# Patient Record
Sex: Female | Born: 1935 | Race: Black or African American | Hispanic: No | Smoking: Former smoker
Health system: Southern US, Community
[De-identification: ages and names within clinical notes are randomized; demographics above are authoritative.]

## PROBLEM LIST (undated history)

## (undated) DIAGNOSIS — M1990 Unspecified osteoarthritis, unspecified site: Secondary | ICD-10-CM

## (undated) DIAGNOSIS — I10 Essential (primary) hypertension: Secondary | ICD-10-CM

## (undated) DIAGNOSIS — E785 Hyperlipidemia, unspecified: Secondary | ICD-10-CM

## (undated) HISTORY — PX: OTHER SURGICAL HISTORY: SHX169

## (undated) HISTORY — DX: Unspecified osteoarthritis, unspecified site: M19.90

## (undated) HISTORY — DX: Essential (primary) hypertension: I10

## (undated) HISTORY — DX: Hyperlipidemia, unspecified: E78.5

---

## 1998-04-05 ENCOUNTER — Other Ambulatory Visit: Admission: RE | Admit: 1998-04-05 | Discharge: 1998-04-05 | Payer: Self-pay | Admitting: Family Medicine

## 1998-04-05 ENCOUNTER — Encounter: Admission: RE | Admit: 1998-04-05 | Discharge: 1998-04-05 | Payer: Self-pay | Admitting: Family Medicine

## 1998-10-11 ENCOUNTER — Encounter: Admission: RE | Admit: 1998-10-11 | Discharge: 1998-10-11 | Payer: Self-pay | Admitting: Family Medicine

## 1999-02-25 ENCOUNTER — Encounter: Admission: RE | Admit: 1999-02-25 | Discharge: 1999-02-25 | Payer: Self-pay | Admitting: Family Medicine

## 1999-03-18 ENCOUNTER — Encounter: Admission: RE | Admit: 1999-03-18 | Discharge: 1999-03-18 | Payer: Self-pay | Admitting: Family Medicine

## 1999-04-06 ENCOUNTER — Encounter: Admission: RE | Admit: 1999-04-06 | Discharge: 1999-04-06 | Payer: Self-pay | Admitting: Family Medicine

## 1999-05-05 ENCOUNTER — Encounter: Admission: RE | Admit: 1999-05-05 | Discharge: 1999-05-05 | Payer: Self-pay | Admitting: Family Medicine

## 1999-05-09 ENCOUNTER — Encounter: Admission: RE | Admit: 1999-05-09 | Discharge: 1999-05-09 | Payer: Self-pay | Admitting: Family Medicine

## 1999-06-20 ENCOUNTER — Encounter: Payer: Self-pay | Admitting: Family Medicine

## 1999-06-20 ENCOUNTER — Encounter: Admission: RE | Admit: 1999-06-20 | Discharge: 1999-06-20 | Payer: Self-pay | Admitting: Family Medicine

## 1999-09-09 ENCOUNTER — Encounter: Admission: RE | Admit: 1999-09-09 | Discharge: 1999-09-09 | Payer: Self-pay | Admitting: Family Medicine

## 1999-10-24 ENCOUNTER — Encounter: Admission: RE | Admit: 1999-10-24 | Discharge: 1999-10-24 | Payer: Self-pay | Admitting: Family Medicine

## 2000-02-20 ENCOUNTER — Encounter: Admission: RE | Admit: 2000-02-20 | Discharge: 2000-02-20 | Payer: Self-pay | Admitting: Family Medicine

## 2000-05-28 ENCOUNTER — Encounter: Admission: RE | Admit: 2000-05-28 | Discharge: 2000-05-28 | Payer: Self-pay | Admitting: Family Medicine

## 2000-06-21 ENCOUNTER — Encounter: Payer: Self-pay | Admitting: Family Medicine

## 2000-06-21 ENCOUNTER — Ambulatory Visit (HOSPITAL_COMMUNITY): Admission: RE | Admit: 2000-06-21 | Discharge: 2000-06-21 | Payer: Self-pay | Admitting: Family Medicine

## 2000-06-27 ENCOUNTER — Encounter: Payer: Self-pay | Admitting: Family Medicine

## 2000-06-27 ENCOUNTER — Encounter: Admission: RE | Admit: 2000-06-27 | Discharge: 2000-06-27 | Payer: Self-pay | Admitting: Family Medicine

## 2000-08-31 ENCOUNTER — Encounter: Admission: RE | Admit: 2000-08-31 | Discharge: 2000-08-31 | Payer: Self-pay | Admitting: Family Medicine

## 2001-03-04 ENCOUNTER — Encounter: Admission: RE | Admit: 2001-03-04 | Discharge: 2001-03-04 | Payer: Self-pay | Admitting: Family Medicine

## 2001-07-02 ENCOUNTER — Ambulatory Visit (HOSPITAL_COMMUNITY): Admission: RE | Admit: 2001-07-02 | Discharge: 2001-07-02 | Payer: Self-pay | Admitting: Obstetrics and Gynecology

## 2001-07-02 ENCOUNTER — Encounter: Payer: Self-pay | Admitting: Obstetrics and Gynecology

## 2002-01-14 ENCOUNTER — Encounter (INDEPENDENT_AMBULATORY_CARE_PROVIDER_SITE_OTHER): Payer: Self-pay | Admitting: *Deleted

## 2002-01-31 ENCOUNTER — Encounter: Admission: RE | Admit: 2002-01-31 | Discharge: 2002-01-31 | Payer: Self-pay | Admitting: Family Medicine

## 2002-02-14 ENCOUNTER — Encounter: Admission: RE | Admit: 2002-02-14 | Discharge: 2002-02-14 | Payer: Self-pay | Admitting: Family Medicine

## 2002-03-07 ENCOUNTER — Encounter: Admission: RE | Admit: 2002-03-07 | Discharge: 2002-03-07 | Payer: Self-pay | Admitting: Family Medicine

## 2002-07-03 ENCOUNTER — Encounter: Payer: Self-pay | Admitting: Obstetrics and Gynecology

## 2002-07-03 ENCOUNTER — Ambulatory Visit (HOSPITAL_COMMUNITY): Admission: RE | Admit: 2002-07-03 | Discharge: 2002-07-03 | Payer: Self-pay | Admitting: Obstetrics and Gynecology

## 2003-06-19 ENCOUNTER — Encounter: Admission: RE | Admit: 2003-06-19 | Discharge: 2003-06-19 | Payer: Self-pay | Admitting: Family Medicine

## 2003-07-07 ENCOUNTER — Ambulatory Visit (HOSPITAL_COMMUNITY): Admission: RE | Admit: 2003-07-07 | Discharge: 2003-07-07 | Payer: Self-pay | Admitting: Obstetrics and Gynecology

## 2003-10-05 ENCOUNTER — Encounter: Admission: RE | Admit: 2003-10-05 | Discharge: 2003-10-05 | Payer: Self-pay | Admitting: Family Medicine

## 2004-05-04 ENCOUNTER — Ambulatory Visit: Payer: Self-pay | Admitting: Sports Medicine

## 2004-05-10 ENCOUNTER — Encounter: Admission: RE | Admit: 2004-05-10 | Discharge: 2004-05-10 | Payer: Self-pay | Admitting: Sports Medicine

## 2004-06-13 ENCOUNTER — Ambulatory Visit: Payer: Self-pay | Admitting: Family Medicine

## 2004-07-22 ENCOUNTER — Ambulatory Visit: Payer: Self-pay | Admitting: Family Medicine

## 2004-09-08 ENCOUNTER — Encounter: Admission: RE | Admit: 2004-09-08 | Discharge: 2004-09-08 | Payer: Self-pay | Admitting: Family Medicine

## 2004-12-19 IMAGING — CR DG CHEST 2V
2 series · 2 of 2 positions shown · non-contrast
Comparison: None.

CLINICAL DATA: Chronic cough.

CHEST - 2 VIEW  05/10/2004:

[view not recorded (1 of 2)]
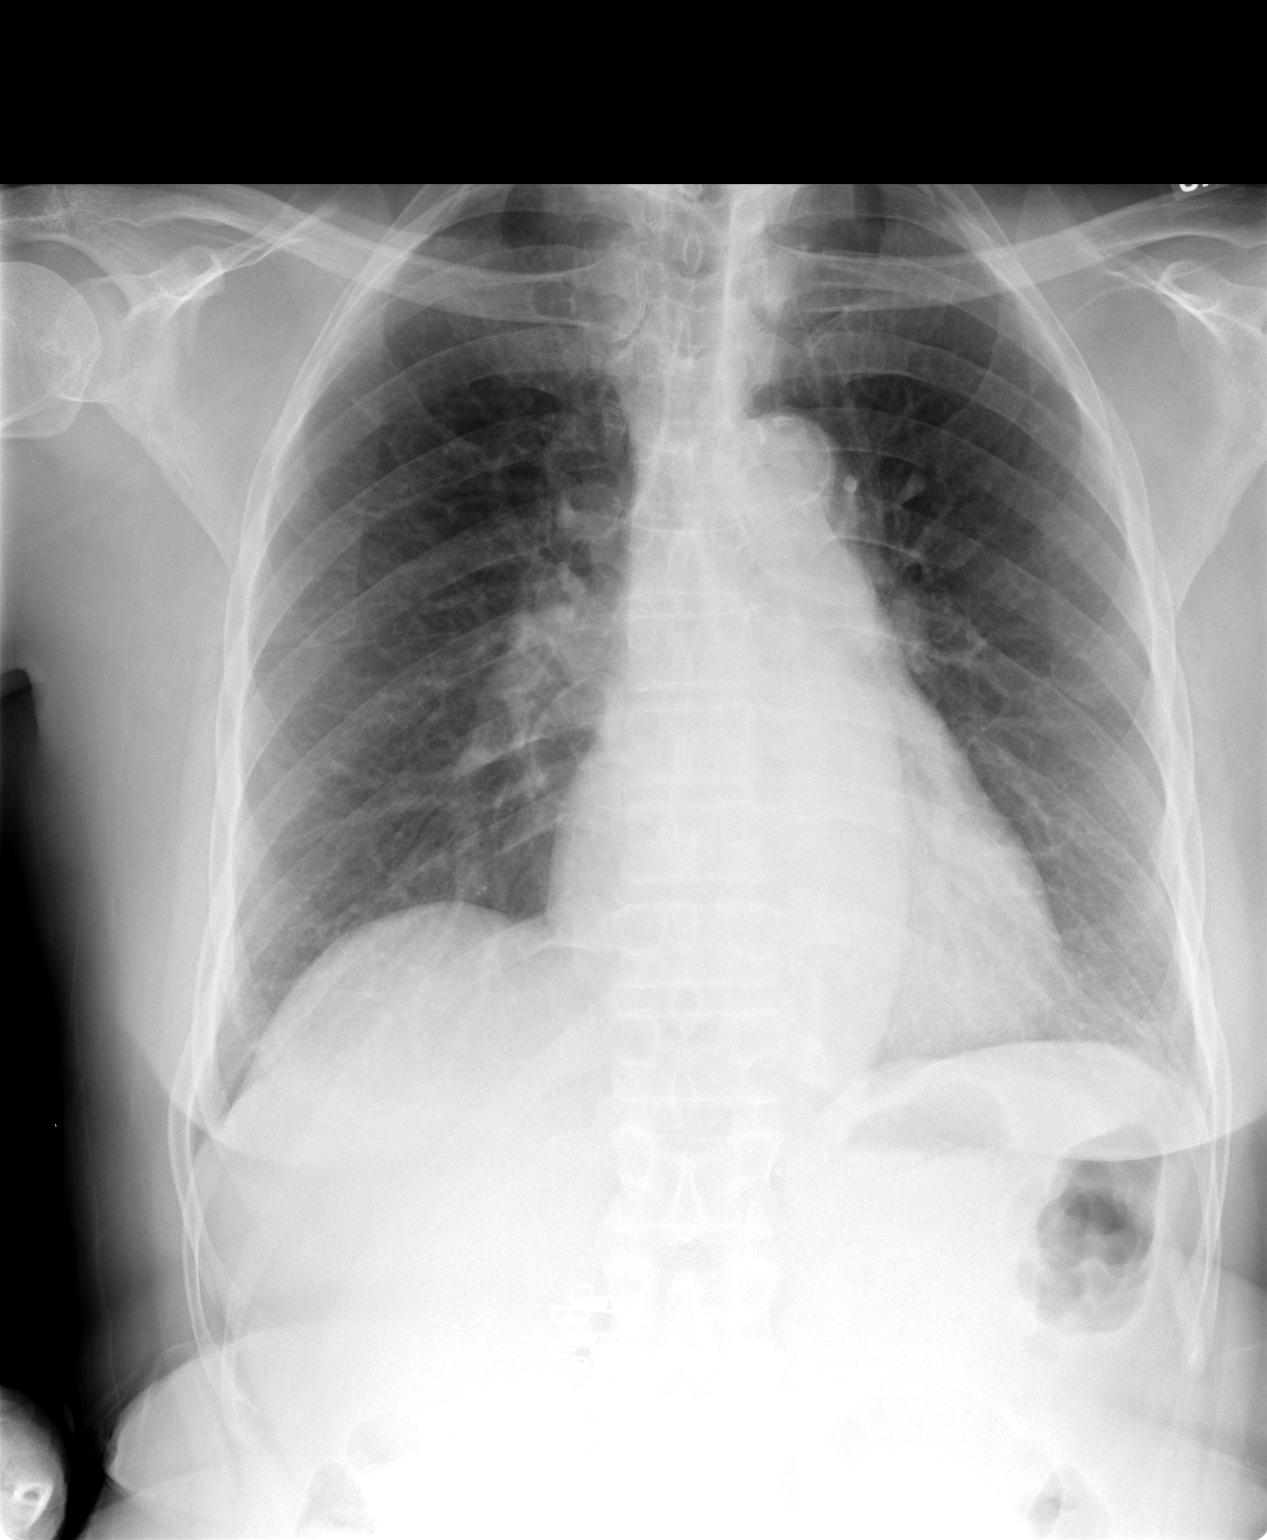

[view not recorded (2 of 2)]
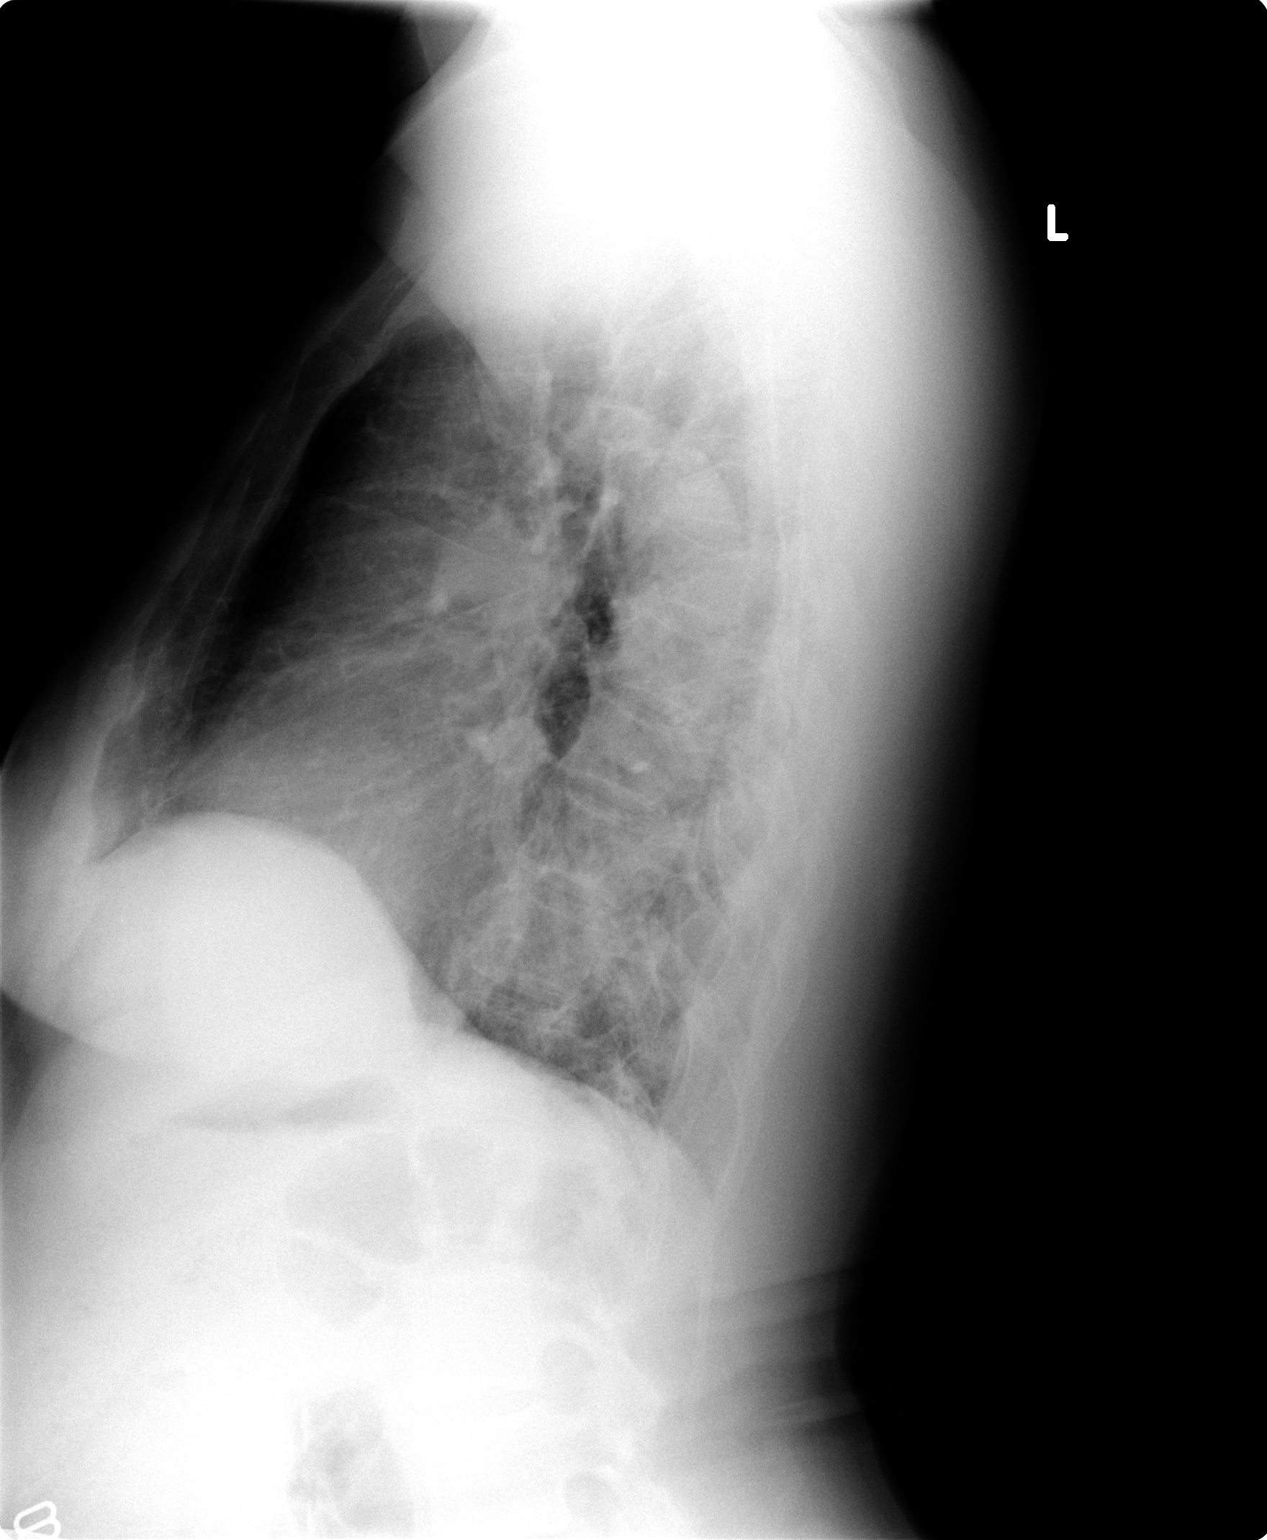

[2 of 2 positions shown; findings below may reference images not displayed]

FINDINGS: The heart size is upper normal to perhaps slightly enlarged. The thoracic aorta is
atherosclerotic. The hilar and mediastinal contours are otherwise unremarkable. The lungs appear
clear. There are no pleural effusions. Mild degenerative changes are present in the thoracic spine.

IMPRESSION

1. Borderline heart size.

2. No evidence of acute disease.

## 2005-03-06 ENCOUNTER — Ambulatory Visit: Payer: Self-pay | Admitting: Family Medicine

## 2005-08-11 ENCOUNTER — Emergency Department (HOSPITAL_COMMUNITY): Admission: EM | Admit: 2005-08-11 | Discharge: 2005-08-11 | Payer: Self-pay | Admitting: Family Medicine

## 2005-10-05 ENCOUNTER — Ambulatory Visit (HOSPITAL_COMMUNITY): Admission: RE | Admit: 2005-10-05 | Discharge: 2005-10-05 | Payer: Self-pay | Admitting: Family Medicine

## 2006-01-08 ENCOUNTER — Ambulatory Visit: Payer: Self-pay | Admitting: Sports Medicine

## 2006-09-13 DIAGNOSIS — M949 Disorder of cartilage, unspecified: Secondary | ICD-10-CM

## 2006-09-13 DIAGNOSIS — R079 Chest pain, unspecified: Secondary | ICD-10-CM

## 2006-09-13 DIAGNOSIS — E7800 Pure hypercholesterolemia, unspecified: Secondary | ICD-10-CM

## 2006-09-13 DIAGNOSIS — M899 Disorder of bone, unspecified: Secondary | ICD-10-CM | POA: Insufficient documentation

## 2006-09-13 DIAGNOSIS — M1990 Unspecified osteoarthritis, unspecified site: Secondary | ICD-10-CM | POA: Insufficient documentation

## 2006-09-13 DIAGNOSIS — H9190 Unspecified hearing loss, unspecified ear: Secondary | ICD-10-CM | POA: Insufficient documentation

## 2006-09-13 DIAGNOSIS — K210 Gastro-esophageal reflux disease with esophagitis: Secondary | ICD-10-CM

## 2006-09-13 DIAGNOSIS — I10 Essential (primary) hypertension: Secondary | ICD-10-CM

## 2006-09-14 ENCOUNTER — Encounter (INDEPENDENT_AMBULATORY_CARE_PROVIDER_SITE_OTHER): Payer: Self-pay | Admitting: *Deleted

## 2006-12-19 ENCOUNTER — Encounter: Payer: Self-pay | Admitting: Family Medicine

## 2006-12-19 ENCOUNTER — Ambulatory Visit (HOSPITAL_COMMUNITY): Admission: RE | Admit: 2006-12-19 | Discharge: 2006-12-19 | Payer: Self-pay | Admitting: Family Medicine

## 2007-06-03 ENCOUNTER — Telehealth: Payer: Self-pay | Admitting: *Deleted

## 2007-06-20 ENCOUNTER — Ambulatory Visit (HOSPITAL_COMMUNITY): Admission: RE | Admit: 2007-06-20 | Discharge: 2007-06-20 | Payer: Self-pay | Admitting: Family Medicine

## 2007-06-20 ENCOUNTER — Ambulatory Visit: Payer: Self-pay | Admitting: Family Medicine

## 2007-06-20 DIAGNOSIS — I498 Other specified cardiac arrhythmias: Secondary | ICD-10-CM

## 2007-06-21 ENCOUNTER — Encounter (INDEPENDENT_AMBULATORY_CARE_PROVIDER_SITE_OTHER): Payer: Self-pay | Admitting: Family Medicine

## 2007-06-21 ENCOUNTER — Ambulatory Visit: Payer: Self-pay | Admitting: Family Medicine

## 2007-06-21 LAB — CONVERTED CEMR LAB
ALT: 16 units/L (ref 0–35)
AST: 20 units/L (ref 0–37)
Albumin: 4.3 g/dL (ref 3.5–5.2)
Chloride: 101 meq/L (ref 96–112)
Creatinine, Ser: 0.94 mg/dL (ref 0.40–1.20)

## 2007-06-25 ENCOUNTER — Encounter: Payer: Self-pay | Admitting: Family Medicine

## 2007-06-26 ENCOUNTER — Encounter (INDEPENDENT_AMBULATORY_CARE_PROVIDER_SITE_OTHER): Payer: Self-pay | Admitting: Family Medicine

## 2007-07-05 ENCOUNTER — Ambulatory Visit: Payer: Self-pay | Admitting: Family Medicine

## 2007-08-21 ENCOUNTER — Telehealth: Payer: Self-pay | Admitting: Family Medicine

## 2007-12-23 ENCOUNTER — Ambulatory Visit (HOSPITAL_COMMUNITY): Admission: RE | Admit: 2007-12-23 | Discharge: 2007-12-23 | Payer: Self-pay | Admitting: Family Medicine

## 2008-03-19 ENCOUNTER — Telehealth: Payer: Self-pay | Admitting: *Deleted

## 2008-03-20 ENCOUNTER — Encounter: Payer: Self-pay | Admitting: *Deleted

## 2008-05-04 ENCOUNTER — Ambulatory Visit: Payer: Self-pay | Admitting: Family Medicine

## 2008-05-04 DIAGNOSIS — M79609 Pain in unspecified limb: Secondary | ICD-10-CM

## 2008-05-04 DIAGNOSIS — H409 Unspecified glaucoma: Secondary | ICD-10-CM | POA: Insufficient documentation

## 2008-05-04 LAB — CONVERTED CEMR LAB
ALT: 8 units/L (ref 0–35)
Albumin: 3.9 g/dL (ref 3.5–5.2)
Alkaline Phosphatase: 93 units/L (ref 39–117)
Chloride: 105 meq/L (ref 96–112)
Creatinine, Ser: 0.75 mg/dL (ref 0.40–1.20)
Total Bilirubin: 0.4 mg/dL (ref 0.3–1.2)
Uric Acid, Serum: 7.9 mg/dL — ABNORMAL HIGH (ref 2.4–7.0)

## 2008-06-10 ENCOUNTER — Encounter: Payer: Self-pay | Admitting: Family Medicine

## 2008-06-15 ENCOUNTER — Encounter: Payer: Self-pay | Admitting: Family Medicine

## 2008-06-15 LAB — CONVERTED CEMR LAB
OCCULT 2: POSITIVE
OCCULT 3: POSITIVE

## 2008-06-17 ENCOUNTER — Encounter: Payer: Self-pay | Admitting: Family Medicine

## 2008-07-01 ENCOUNTER — Ambulatory Visit: Payer: Self-pay | Admitting: Family Medicine

## 2008-07-01 DIAGNOSIS — K921 Melena: Secondary | ICD-10-CM

## 2008-07-21 ENCOUNTER — Ambulatory Visit: Payer: Self-pay | Admitting: Gastroenterology

## 2008-07-22 ENCOUNTER — Telehealth: Payer: Self-pay | Admitting: *Deleted

## 2008-07-31 ENCOUNTER — Ambulatory Visit: Payer: Self-pay | Admitting: Gastroenterology

## 2008-08-06 ENCOUNTER — Ambulatory Visit: Payer: Self-pay | Admitting: Family Medicine

## 2008-08-06 ENCOUNTER — Encounter: Payer: Self-pay | Admitting: Family Medicine

## 2008-08-14 ENCOUNTER — Telehealth (INDEPENDENT_AMBULATORY_CARE_PROVIDER_SITE_OTHER): Payer: Self-pay | Admitting: *Deleted

## 2008-11-19 ENCOUNTER — Telehealth: Payer: Self-pay | Admitting: *Deleted

## 2008-12-01 ENCOUNTER — Telehealth: Payer: Self-pay | Admitting: *Deleted

## 2008-12-08 ENCOUNTER — Telehealth: Payer: Self-pay | Admitting: *Deleted

## 2008-12-31 ENCOUNTER — Telehealth (INDEPENDENT_AMBULATORY_CARE_PROVIDER_SITE_OTHER): Payer: Self-pay | Admitting: *Deleted

## 2009-01-04 ENCOUNTER — Encounter: Payer: Self-pay | Admitting: Family Medicine

## 2009-01-07 ENCOUNTER — Ambulatory Visit (HOSPITAL_COMMUNITY): Admission: RE | Admit: 2009-01-07 | Discharge: 2009-01-07 | Payer: Self-pay | Admitting: Family Medicine

## 2009-05-05 ENCOUNTER — Encounter: Payer: Self-pay | Admitting: Family Medicine

## 2009-10-28 ENCOUNTER — Ambulatory Visit: Payer: Self-pay | Admitting: Family Medicine

## 2009-10-28 ENCOUNTER — Encounter: Payer: Self-pay | Admitting: Family Medicine

## 2009-10-28 DIAGNOSIS — I10 Essential (primary) hypertension: Secondary | ICD-10-CM

## 2009-11-19 ENCOUNTER — Encounter: Payer: Self-pay | Admitting: Family Medicine

## 2009-11-24 ENCOUNTER — Telehealth (INDEPENDENT_AMBULATORY_CARE_PROVIDER_SITE_OTHER): Payer: Self-pay | Admitting: *Deleted

## 2010-01-05 ENCOUNTER — Ambulatory Visit: Payer: Self-pay | Admitting: Family Medicine

## 2010-01-05 LAB — CONVERTED CEMR LAB
ALT: 10 units/L (ref 0–35)
Cholesterol: 160 mg/dL (ref 0–200)
HDL: 50 mg/dL (ref >39)
Total CHOL/HDL Ratio: 3.2
Triglycerides: 83 mg/dL (ref <150)
VLDL: 17 mg/dL (ref 0–40)

## 2010-01-10 ENCOUNTER — Ambulatory Visit (HOSPITAL_COMMUNITY): Admission: RE | Admit: 2010-01-10 | Discharge: 2010-01-10 | Payer: Self-pay | Admitting: Family Medicine

## 2010-04-04 ENCOUNTER — Telehealth: Payer: Self-pay | Admitting: Family Medicine

## 2010-04-05 ENCOUNTER — Telehealth (INDEPENDENT_AMBULATORY_CARE_PROVIDER_SITE_OTHER): Payer: Self-pay | Admitting: *Deleted

## 2010-04-15 ENCOUNTER — Ambulatory Visit: Payer: Self-pay | Admitting: Family Medicine

## 2010-04-15 DIAGNOSIS — M25559 Pain in unspecified hip: Secondary | ICD-10-CM | POA: Insufficient documentation

## 2010-04-21 LAB — CONVERTED CEMR LAB
Chloride: 101 meq/L (ref 96–112)
Creatinine, Ser: 0.86 mg/dL (ref 0.40–1.20)
Glucose, Bld: 159 mg/dL — ABNORMAL HIGH (ref 70–99)
Potassium: 4.1 meq/L (ref 3.5–5.3)
Sodium: 139 meq/L (ref 135–145)

## 2010-05-05 ENCOUNTER — Telehealth: Payer: Self-pay | Admitting: *Deleted

## 2010-08-08 ENCOUNTER — Emergency Department (HOSPITAL_COMMUNITY)
Admission: EM | Admit: 2010-08-08 | Discharge: 2010-08-08 | Payer: Self-pay | Source: Home / Self Care | Admitting: Emergency Medicine

## 2010-08-08 ENCOUNTER — Encounter: Payer: Self-pay | Admitting: Family Medicine

## 2010-08-12 ENCOUNTER — Ambulatory Visit
Admission: RE | Admit: 2010-08-12 | Discharge: 2010-08-12 | Payer: Self-pay | Source: Home / Self Care | Attending: Family Medicine | Admitting: Family Medicine

## 2010-08-12 DIAGNOSIS — M169 Osteoarthritis of hip, unspecified: Secondary | ICD-10-CM | POA: Insufficient documentation

## 2010-08-15 ENCOUNTER — Other Ambulatory Visit: Payer: Self-pay | Admitting: Family Medicine

## 2010-08-16 ENCOUNTER — Other Ambulatory Visit: Payer: Self-pay | Admitting: Family Medicine

## 2010-08-16 DIAGNOSIS — M25552 Pain in left hip: Secondary | ICD-10-CM

## 2010-08-16 NOTE — Assessment & Plan Note (Signed)
Summary: bp/eo    Vital Signs:  Patient profile:   77 year old female Height:      69 inches Temp:     98.9 degrees F oral Pulse rate:   71 / minute BP sitting:   192 / 84  (left arm) Cuff size:   regular  Vitals Entered By: Tessie Fass CMA (January 05, 2010 3:51 PM) CC: F/U BP Is Patient Diabetic? No Pain Assessment Patient in pain? no        Primary Care Provider:  Doralee Albino MD  CC:  F/U BP.  History of Present Illness: Med review.  Inadvertantly stopped atacand with confusion with atenolol.  Clarified, no atenolol.  continue atacand Best to call daughter, Stormy Card, at work [phone].  OK to give results to her.   No other complaints.  Habits & Providers  Alcohol-Tobacco-Diet     Tobacco Status: never  Current Medications (verified): 1)  Atacand 32 Mg  Tabs (Candesartan Cilexetil) .... Take One Tablet By Mouth Daily 2)  Bayer Aspirin 325 Mg Tabs (Aspirin) .... Take 1 Tablet By Mouth Once A Day 3)  Hydrochlorothiazide 12.5 Mg  Tabs (Hydrochlorothiazide) .... Take 1 Tab  By Mouth Every Morning 4)  Simvastatin 80 Mg Tabs (Simvastatin) .... One By Mouth Daily 5)  Sm Calcium/vitamin D 500-200 Mg-Unit Tabs (Calcium Carbonate-Vitamin D) .... Take 1 Tablet By Mouth Twice A Day 6)  Clonidine Hcl 0.2 Mg  Tabs (Clonidine Hcl) .... One Tab Two Times A Day 7)  Methazolamide 50 Mg Tabs (Methazolamide) .... One Tab By Mouth Tid 8)  Travatan 0.004 % Soln (Travoprost) .... Use Drops Daily 9)  Isopto Carpine 4 % Soln (Pilocarpine Hcl) .... Use Qid 10)  Alphagan P 0.1 % Soln (Brimonidine Tartrate) .... Use Bid 11)  Timoptic 0.5 % Soln (Timolol Maleate) .... Use Bid  Allergies (verified): 1)  ! Cortisone 2)  Enalapril Maleate (Enalapril Maleate) 3)  * Ace  Past History:  Past medical, surgical, family and social histories (including risk factors) reviewed, and no changes noted (except as noted below).  Past Medical History: Reviewed history from 09/13/2006 and no  changes required. pancreatitis idiopathic 1990, postmenopausal  Past Surgical History: Reviewed history from 09/13/2006 and no changes required. Bone Density 10/00 T=-1.43 -, breast biopsy 1995 bnign ductal adenoma -, Cataract surgery Lt eye 8/99 -  Family History: Reviewed history from 09/13/2006 and no changes required. - Ca, CVA, + HBP, DM, Cataract, OHD  Social History: Reviewed history from 09/13/2006 and no changes required. 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 Lungs are clear to auscultation, no crackles or wheezes. Heart:  Normal rate and regular rhythm. S1 and S2 normal without gallop, murmur, click, rub or other extra sounds. Extremities:  No clubbing, cyanosis, edema, or deformity noted with normal full range of motion of all joints.     Impression & Recommendations:  Problem # 1:  ESSENTIAL HYPERTENSION (ICD-401.9) Assessment Deteriorated  Restart Atacand Her updated medication list for this problem includes:    Atacand 32 Mg Tabs (Candesartan cilexetil) .Marland Kitchen... Take one tablet by mouth daily    Hydrochlorothiazide 12.5 Mg Tabs (Hydrochlorothiazide) .Marland Kitchen... Take 1 tab  by mouth every morning    Clonidine Hcl 0.2 Mg Tabs (Clonidine hcl) ..... One tab two times a day    Methazolamide 50 Mg Tabs (Methazolamide) .Marland KitchenMarland KitchenMarland KitchenMarland Kitchen  One tab by mouth tid  Orders: [facility]- Est Level  3 ([address])  Problem # 2:  HYPERCHOLESTEROLEMIA (ICD-272.0) Check labs. Her updated medication list for this problem includes:    Simvastatin 80 Mg Tabs (Simvastatin) ..... One by mouth daily  Orders: T-Lipid Profile ([id number]) [facility]- Est Level  3 (91478)  Complete Medication List: 1)  Atacand 32 Mg Tabs (Candesartan cilexetil) .... Take one tablet by mouth daily 2)  Bayer Aspirin 325 Mg Tabs  (Aspirin) .... Take 1 tablet by mouth once a day 3)  Hydrochlorothiazide 12.5 Mg Tabs (Hydrochlorothiazide) .... Take 1 tab  by mouth every morning 4)  Simvastatin 80 Mg Tabs (Simvastatin) .... One by mouth daily 5)  Sm Calcium/vitamin D 500-200 Mg-unit Tabs (Calcium carbonate-vitamin d) .... Take 1 tablet by mouth twice a day 6)  Clonidine Hcl 0.2 Mg Tabs (Clonidine hcl) .... One tab two times a day 7)  Methazolamide 50 Mg Tabs (Methazolamide) .... One tab by mouth tid 8)  Travatan 0.004 % Soln (Travoprost) .... Use drops daily 9)  Isopto Carpine 4 % Soln (Pilocarpine hcl) .... Use qid 10)  Alphagan P 0.1 % Soln (Brimonidine tartrate) .... Use bid 11)  Timoptic 0.5 % Soln (Timolol maleate) .... Use bid  Other Orders: T-Hepatic Function [phone])  Patient Instructions: 1)  You used to be on a high blood pressure med called atenolol that I don't want you to take any more. 2)  I do want you to take atacand another blood pressure pill 3)  Check your blood pressure several times after you have been back on the atacand for at least one week. Prescriptions: ATACAND 32 MG  TABS (CANDESARTAN CILEXETIL) Take one tablet by mouth daily  #90 x 3   Entered and Authorized by:   Doralee Albino MD   Signed by:   Doralee Albino MD on 01/05/2010   Method used:   Electronically to        [facility] [phone]* (retail)       [address]       [place], [place]  [address]       Ph: [phone]       Fax: [phone]   RxID:   [id number]    Prevention & Chronic Care Immunizations   Influenza vaccine: given  (05/04/2008)   Influenza vaccine due: 05/04/2009    Tetanus booster: 05/04/2008: given   Tetanus booster due: 05/04/2018    Pneumococcal vaccine: Pneumovax  (07/05/2007)   Pneumococcal vaccine due: None    H. zoster vaccine: 08/06/2008: Zostavax  Colorectal Screening   Hemoccult: abnormal  (06/10/2008)   Hemoccult due: Not Indicated    Colonoscopy: Location:  [facility].    (07/31/2008)   Colonoscopy due: 07/2018  Other Screening   Pap smear: Done.  (01/14/2002)   Pap smear due: Not Indicated    Mammogram: normal  (01/07/2009)   Mammogram due: 01/07/2010    DXA bone density scan: Done.  (04/17/1999)   DXA scan due: None    Smoking status: never  (01/05/2010)  Lipids   Total Cholesterol: 138  (05/04/2008)   Lipid panel action/deferral: Lipid Panel ordered   LDL: 73  (05/04/2008)   LDL Direct: Not documented   HDL: 40  (05/04/2008)   Triglycerides: 125  (05/04/2008)    SGOT (AST): 17  (05/04/2008)   BMP action: Ordered   SGPT (ALT): 8  (05/04/2008)   Alkaline phosphatase: 93  (05/04/2008)   Total bilirubin: 0.4  (05/04/2008)  Lipid flowsheet reviewed?: Yes   Progress toward LDL goal: At goal  Hypertension   Last Blood Pressure: 192 / 84  (01/05/2010)   Serum creatinine: 0.75  (05/04/2008)   Serum potassium 4.0  (05/04/2008)    Hypertension flowsheet reviewed?: Yes   Progress toward BP goal: Deteriorated  Self-Management Support :    Hypertension self-management support: Written self-care plan  (01/05/2010)   Hypertension self-care plan printed.    Lipid self-management support: Written self-care plan  (01/05/2010)   Lipid self-care plan printed.

## 2010-08-16 NOTE — Miscellaneous (Signed)
Summary: walk in  Clinical Lists Changes came in with her dtr. missed appt earlier. c/o hip pain. otcs not helping. wanted to be seen now as dtr was off now (works at [employer]) dtr translates-mom is deaf. appt made for work in now.[place]en Circle RN  October 28, 2009 4:24 PM

## 2010-08-16 NOTE — Progress Notes (Signed)
  Phone Note Call from Patient   Caller: Daughter-Jennifer Call For: [phone] Summary of Call: Ms. Mccollister need new rx sent to [facility] on [address] for Simvastatin 80 mg for 90 day supply.  She no longer uses mail order refills Initial call taken by: Abundio Miu,  May 05, 2010 3:07 PM  Follow-up for Phone Call        Rx sent and pt dgt informed. Follow-up by: Jone Baseman CMA,  May 05, 2010 5:02 PM    Prescriptions: SIMVASTATIN 80 MG TABS (SIMVASTATIN) one by mouth daily  #90 Tablet x 2   Entered by:   Jone Baseman CMA   Authorized by:   Doralee Albino MD   Signed by:   Jone Baseman CMA on 05/05/2010   Method used:   Electronically to        [facility] [phone]* (retail)       [address]       [place], [place]  [address]       Ph: [phone]       Fax: [phone]   RxID:   [id number]

## 2010-08-16 NOTE — Progress Notes (Signed)
Summary: Rx Prob  Phone Note Call from Patient Call back at [phone]   Caller: [facility] Summary of Call: Can't get medication due to finances.  Is there somewhere or someone who can help her. Initial call taken by: Clydell Hakim,  April 05, 2010 11:07 AM  Follow-up for Phone Call        Given the constraints, stop atacand/losartin.  I have sent a new rx for a BP med that should be $4.  Pick that up and take it.  See me in 1-2 weeks to make sure BP is under control.  Please notify patient through daughter. Follow-up by: Doralee Albino MD,  April 05, 2010 11:26 AM    New/Updated Medications: AMLODIPINE BESYLATE 10 MG  TABS (AMLODIPINE BESYLATE) once daily Prescriptions: AMLODIPINE BESYLATE 10 MG  TABS (AMLODIPINE BESYLATE) once daily  #30 x 12   Entered and Authorized by:   Doralee Albino MD   Signed by:   Doralee Albino MD on 04/05/2010   Method used:   Electronically to        [facility] [phone]* (retail)       [address]       [place], [place]  [address]       Ph: [phone]       Fax: [phone]   RxID:   [id number]  daughter  notified . patient has appointment 04/15/2010. Theresia Lo RN  April 05, 2010 11:32 AM

## 2010-08-16 NOTE — Assessment & Plan Note (Signed)
Summary: hip pain. otcs not helping//hensel   Vital Signs:  Patient profile:   77 year old female Height:      69 inches Weight:      181.2 pounds Temp:     99.4 degrees F oral Pulse rate:   78 / minute BP sitting:   180 / 94  (left arm)  Vitals Entered By: Gladstone Pih (October 28, 2009 3:46 PM)  Serial Vital Signs/Assessments:  Time      Position  BP       Pulse  Resp  Temp     By                     155/82                         Renold Don MD                              20                    Renold Don MD  CC: C/O fell X3, Right hip hurting for 2-3 months Comments wants to be checked for Blood clots, wants MRI   Primary Care Provider:  Doralee Albino MD  CC:  C/O fell X3 and Right hip hurting for 2-3 months.  History of Present Illness: Patient spoke through sign language interpreter.  Pain in right hip that started 2-3 months ago.  Feel on Left side 3 months ago while walking at work, lost balance as she was walking off porch 2 months ago and fell again, also on Left side.  1 week ago, fell 3rd time, this time on Right side.  Does not feel like she lost her balance any of three falls.  Describes pain as throbbing and aching in nature, 4/10.  Has tried Tylenol arthritis 3 months ago, Ibuprofen and Alleve with inconsistent relief since then.  Has not taken anything since falling 1 week ago.  Pain better today.  Patient asking if need for MRI. Feels Ibuprofen helped more than Tylenol.  ROS:  No bruising, no leg edema, no warmth or redness.  No shortness of breath/chest pain.  No palpitations.  No headaches.    Current Problems (verified): 1)  Leg Pain, Right  (ICD-729.5) 2)  Essential Hypertension  (ICD-401.9) 3)  Need Proph Vaccination&inoculat Oth Viral Dz  (ICD-V04.89) 4)  Blood in Stool  (ICD-578.1) 5)  Foot Pain  (ICD-729.5) 6)  Glaucoma Nos  (ICD-365.9) 7)  Bradycardia  (ICD-427.89) 8)  Reflux Esophagitis  (ICD-530.11) 9)  Osteopenia  (ICD-733.90) 10)   Hypertension, Benign Systemic  (ICD-401.1) 11)  Hypercholesterolemia  (ICD-272.0) 12)  Hearing Loss Nos or Deafness  (ICD-389.9) 13)  Djd, Unspecified  (ICD-715.90) 14)  Chest Pain  (ICD-786.50)  Current Medications (verified): 1)  Atacand 32 Mg  Tabs (Candesartan Cilexetil) .... Take One Tablet By Mouth Daily 2)  Bayer Aspirin 325 Mg Tabs (Aspirin) .... Take 1 Tablet By Mouth Once A Day 3)  Hydrochlorothiazide 12.5 Mg  Tabs (Hydrochlorothiazide) .... Take 1 Tab  By Mouth Every Morning 4)  Simvastatin 80 Mg Tabs (Simvastatin) .... One By Mouth Daily 5)  Sm Calcium/vitamin D 500-200 Mg-Unit Tabs (Calcium Carbonate-Vitamin D) .... Take 1 Tablet By Mouth Twice A Day 6)  Clonidine Hcl 0.2 Mg  Tabs (Clonidine Hcl) .... One Tab Two Times A Day 7)  Methazolamide 50 Mg Tabs (Methazolamide) .... One Tab By Mouth Tid 8)  Travatan 0.004 % Soln (Travoprost) .... Use Drops Daily 9)  Isopto Carpine 4 % Soln (Pilocarpine Hcl) .... Use Qid 10)  Alphagan P 0.1 % Soln (Brimonidine Tartrate) .... Use Bid 11)  Timoptic 0.5 % Soln (Timolol Maleate) .... Use Bid  Allergies (verified): 1)  ! Cortisone 2)  Enalapril Maleate (Enalapril Maleate) 3)  * Ace  Past History:  Past medical, surgical, family and social histories (including risk factors) reviewed, and no changes noted (except as noted below).  Past Medical History: Reviewed history from 09/13/2006 and no changes required. pancreatitis idiopathic 1990, postmenopausal  Past Surgical History: Reviewed history from 09/13/2006 and no changes required. Bone Density 10/00 T=-1.43 -, breast biopsy 1995 bnign ductal adenoma -, Cataract surgery Lt eye 8/99 -  Family History: Reviewed history from 09/13/2006 and no changes required. - Ca, CVA, + HBP, DM, Cataract, OHD  Social History: Reviewed history from 09/13/2006 and no changes required. 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   Well-developed, well-nourished patient in NAD.  Awake, cooperative. Eyes:  pupils equal, pupils round, and pupils reactive to light.   Mouth:  oral mucosa moist Lungs:  clear to auscultation bilaterally  Heart:  RRR without murmur Msk:  Full active and passive range of motion bilateral lower extremities without pain.   Extremities:  no bruising noted bilateral lower extremities.  No warmth to touch, no edema or swelling.  No redness.  Mild 2/10 tenderness to palpation along lateral aspect of thigh.  No pain on palpation of hip.   Neurologic:  Gait normal, no limp noted.  sensation intact to light touch.     Impression & Recommendations:  Problem # 1:  LEG PAIN, RIGHT (ICD-729.5) Feel this is most likely bruised muscle from fall.  No signs of blood clot.  Do not feel this is hip or arthritic pain based on palpation.  When asked to point to where pain hurt worst, patient points to lateral aspect of thigh.  Recommended patient continue with OTC Tylenol or Ibuprofen.  Noticed hx/o possible rectal bleed in Dec 2009.  Colonscopy within normal limits, no symptoms since that time. Discussed extensively with patient need to stop Ibuprofen if any signs of bleeding. Recommended if patient has Tylenol she can take that instead.  Would rather do only Tylenol based on possible hx/o rectal bleed, but patient has been taking it for past 2 months intermittently without any further symptoms.  Recommended she only take it as needed every 6 hours. Orders: [facility]- Est Level  3 ([address])  Problem # 2:  ESSENTIAL HYPERTENSION (ICD-401.9) Schedule follow-up appointment within the month to see Dr. Leveda Anna and review her medications.  Patient states she takes them regularly every single day.   Her updated medication list for this problem includes:    Atacand 32 Mg Tabs (Candesartan cilexetil) .Marland Kitchen... Take one tablet by mouth daily    Hydrochlorothiazide 12.5 Mg Tabs  (Hydrochlorothiazide) .Marland Kitchen... Take 1 tab  by mouth every morning    Clonidine Hcl 0.2 Mg Tabs (Clonidine hcl) ..... One tab two times a day    Methazolamide 50 Mg Tabs (Methazolamide) ..... One tab by mouth tid  Complete Medication List: 1)  Atacand 32 Mg Tabs (Candesartan cilexetil) .... Take one tablet by mouth daily 2)  Bayer Aspirin 325  Mg Tabs (Aspirin) .... Take 1 tablet by mouth once a day 3)  Hydrochlorothiazide 12.5 Mg Tabs (Hydrochlorothiazide) .... Take 1 tab  by mouth every morning 4)  Simvastatin 80 Mg Tabs (Simvastatin) .... One by mouth daily 5)  Sm Calcium/vitamin D 500-200 Mg-unit Tabs (Calcium carbonate-vitamin d) .... Take 1 tablet by mouth twice a day 6)  Clonidine Hcl 0.2 Mg Tabs (Clonidine hcl) .... One tab two times a day 7)  Methazolamide 50 Mg Tabs (Methazolamide) .... One tab by mouth tid 8)  Travatan 0.004 % Soln (Travoprost) .... Use drops daily 9)  Isopto Carpine 4 % Soln (Pilocarpine hcl) .... Use qid 10)  Alphagan P 0.1 % Soln (Brimonidine tartrate) .... Use bid 11)  Timoptic 0.5 % Soln (Timolol maleate) .... Use bid  Patient Instructions: 1)  Feel that the pain in your leg is most likely a bruise from falling. 2)  If you continue to fall, please come back and see Dr. Leveda Anna. 3)  Take the Ibuprofen 400 mg every 6 hours if needed for pain.  4)  If you have any more bleeding at all, STOP the Ibuprofen and call our clinic.  You can still take the Tyelnol. 5)  It was good to see you again.

## 2010-08-16 NOTE — Progress Notes (Signed)
Summary: Rx Prob  Phone Note Call from Patient Call back at [phone]   Caller: [name] Daughter Summary of Call: Was trying to get rx for Atenolol 100mg  filled and pharmacy says she is no longer on this.  They need to know for sure. Initial call taken by: Clydell Hakim,  Nov 24, 2009 11:23 AM  Follow-up for Phone Call        spoke with [facility] and advised that Dr. Leveda Anna stated his records show that patient is not on this med. then daughter realizes that patient had called in rx from an old bottle. advised to call for appointment soon.   Follow-up by: Theresia Lo RN,  Nov 24, 2009 11:27 AM

## 2010-08-16 NOTE — Assessment & Plan Note (Signed)
Summary: f/u leg pain,df  FLU SHOT GIVEN TODAY.Pamela Rivas, CMA  April 15, 2010 5:08 PM   Vital Signs:  Patient profile:   77 year old female Height:      69 inches Weight:      177 pounds BMI:     26.23 Temp:     99.1 degrees F Pulse rate:   72 / minute BP sitting:   167 / 83  (right arm)  Vitals Entered By: Pamela Pages, RN CC: f/u leg pain and meds Pain Assessment Patient in pain? no        Primary Care Provider:  Doralee Albino MD  CC:  f/u leg pain and meds.  History of Present Illness: Pain in left hip.  Hx of back pain.  This seems more hip.  No recent Xrays.  Does have clinical diagnosis of osteoarthritis.   Also recheck BP since recent med change. Daughter, Pamela Rivas, [phone] for lab and Xray results  Habits & Providers  Alcohol-Tobacco-Diet     Tobacco Status: quit     Tobacco Counseling: to remain off tobacco products     Year Quit: 1980  Current Medications (verified): 1)  Amlodipine Besylate 10 Mg  Tabs (Amlodipine Besylate) .... Once Daily 2)  Bayer Aspirin 325 Mg Tabs (Aspirin) .... Take 1 Tablet By Mouth Once A Day 3)  Hydrochlorothiazide 12.5 Mg  Tabs (Hydrochlorothiazide) .... Take 1 Tab  By Mouth Every Morning 4)  Simvastatin 80 Mg Tabs (Simvastatin) .... One By Mouth Daily 5)  Sm Calcium/vitamin D 500-200 Mg-Unit Tabs (Calcium Carbonate-Vitamin D) .... Take 1 Tablet By Mouth Twice A Day 6)  Clonidine Hcl 0.2 Mg  Tabs (Clonidine Hcl) .... One Tab Two Times A Day 7)  Travatan 0.004 % Soln (Travoprost) .... Use Drops Daily 8)  Isopto Carpine 4 % Soln (Pilocarpine Hcl) .... Use Qid 9)  Alphagan P 0.1 % Soln (Brimonidine Tartrate) .... Use Bid 10)  Timoptic 0.5 % Soln (Timolol Maleate) .... Use Bid  Allergies (verified): 1)  ! Cortisone 2)  Enalapril Maleate (Enalapril Maleate) 3)  * Ace  Past History:  Past medical, surgical, family and social histories (including risk factors) reviewed, and no changes noted (except as noted below).  Past  Medical History: Reviewed history from 09/13/2006 and no changes required. pancreatitis idiopathic 1990, postmenopausal  Past Surgical History: Reviewed history from 09/13/2006 and no changes required. Bone Density 10/00 T=-1.43 -, breast biopsy 1995 bnign ductal adenoma -, Cataract surgery Lt eye 8/99 -  Family History: Reviewed history from 09/13/2006 and no changes required. - Ca, CVA, + HBP, DM, Cataract, OHD  Social History: Reviewed history from 09/13/2006 and no changes required. 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  Normal strength, gait and DTRs   Impression & Recommendations:  Problem # 1:  HIP PAIN, LEFT, CHRONIC (ICD-719.45) Suspect hip osteoarthritis although radiation from back is also possible. Her updated medication list for this problem includes:    Bayer Aspirin 325 Mg Tabs (Aspirin) .Marland Kitchen... Take 1 tablet by mouth once a day  Orders: Radiology other (Radiology Other) [facility]- Est Level  3 (03474)  Problem # 2:  ESSENTIAL HYPERTENSION (ICD-401.9)  Due for BMP.   The following medications were removed from the medication list:    Methazolamide 50 Mg Tabs (Methazolamide) ..... One tab by mouth  tid Her updated medication list for this problem includes:    Amlodipine Besylate 10 Mg Tabs (Amlodipine besylate) ..... Once daily    Hydrochlorothiazide 12.5 Mg Tabs (Hydrochlorothiazide) .Marland Kitchen... Take 1 tab  by mouth every morning    Clonidine Hcl 0.2 Mg Tabs (Clonidine hcl) ..... One tab two times a day  BP today: 167/83 Prior BP: 192/84 (01/05/2010)  Labs Reviewed: K+: 4.0 (05/04/2008) Creat: : 0.75 (05/04/2008)   Chol: 160 (01/05/2010)   HDL: 50 (01/05/2010)   LDL: 93 (01/05/2010)   TG: 83 (01/05/2010)  Orders: [facility]- Est Level  3  ([address])  Complete Medication List: 1)  Amlodipine Besylate 10 Mg Tabs (Amlodipine besylate) .... Once daily 2)  Bayer Aspirin 325 Mg Tabs (Aspirin) .... Take 1 tablet by mouth once a day 3)  Hydrochlorothiazide 12.5 Mg Tabs (Hydrochlorothiazide) .... Take 1 tab  by mouth every morning 4)  Simvastatin 80 Mg Tabs (Simvastatin) .... One by mouth daily 5)  Sm Calcium/vitamin D 500-200 Mg-unit Tabs (Calcium carbonate-vitamin d) .... Take 1 tablet by mouth twice a day 6)  Clonidine Hcl 0.2 Mg Tabs (Clonidine hcl) .... One tab two times a day 7)  Travatan 0.004 % Soln (Travoprost) .... Use drops daily 8)  Isopto Carpine 4 % Soln (Pilocarpine hcl) .... Use qid 9)  Alphagan P 0.1 % Soln (Brimonidine tartrate) .... Use bid 10)  Timoptic 0.5 % Soln (Timolol maleate) .... Use bid  Other Orders: T-Basic Metabolic Panel [phone]) Flu Vaccine 64yrs + [phone]) Admin 1st Vaccine ([address])   Prevention & Chronic Care Immunizations   Influenza vaccine: Fluvax 3+  (04/15/2010)   Influenza vaccine due: 05/04/2009    Tetanus booster: 05/04/2008: given   Tetanus booster due: 05/04/2018    Pneumococcal vaccine: Pneumovax  (07/05/2007)   Pneumococcal vaccine due: None    H. zoster vaccine: 08/06/2008: Zostavax  Colorectal Screening   Hemoccult: abnormal  (06/10/2008)   Hemoccult due: Not Indicated    Colonoscopy: Location:  [facility].    (07/31/2008)   Colonoscopy due: 07/2018  Other Screening   Pap smear: Done.  (01/14/2002)   Pap smear due: Not Indicated    Mammogram: ASSESSMENT: Negative - BI-RADS 1^MM DIGITAL SCREENING  (01/10/2010)   Mammogram due: 01/11/2011    DXA bone density scan: Done.  (04/17/1999)   DXA scan due: None    Smoking status: quit  (04/15/2010)  Lipids   Total Cholesterol: 160  (01/05/2010)   Lipid panel action/deferral: Lipid Panel ordered   LDL: 93  (01/05/2010)   LDL Direct: Not documented   HDL: 50  (01/05/2010)   Triglycerides: 83   (01/05/2010)    SGOT (AST): 16  (01/05/2010)   BMP action: Ordered   SGPT (ALT): 10  (01/05/2010)   Alkaline phosphatase: 85  (01/05/2010)   Total bilirubin: 0.4  (01/05/2010)    Lipid flowsheet reviewed?: Yes   Progress toward LDL goal: At goal  Hypertension   Last Blood Pressure: 167 / 83  (04/15/2010)   Serum creatinine: 0.75  (05/04/2008)   BMP action: Ordered   Serum potassium 4.0  (05/04/2008)    Hypertension flowsheet reviewed?: Yes   Progress toward BP goal: Improved  Self-Management Support :    Hypertension self-management support: Written self-care plan  (01/05/2010)    Lipid self-management support: Written self-care plan  (01/05/2010)    Nursing Instructions: Give Flu vaccine today    Immunizations Administered:  Influenza Vaccine # 1:    Vaccine Type: Fluvax 3+  Site: left deltoid    Mfr: Sanofi Pasteur    Dose: 0.5 ml    Route: IM    Given by: Pamela Rivas, CMA    Exp. Date: 01/11/2011    Lot #: [id number]    VIS given: 02/08/10 version given April 15, 2010.  Flu Vaccine Consent Questions:    Do you have a history of severe allergic reactions to this vaccine? no    Any prior history of allergic reactions to egg and/or gelatin? no    Do you have a sensitivity to the preservative Thimersol? no    Do you have a past history of Guillan-Barre Syndrome? no    Do you currently have an acute febrile illness? no    Have you ever had a severe reaction to latex? no    Vaccine information given and explained to patient? yes    Are you currently pregnant? no

## 2010-08-16 NOTE — Progress Notes (Signed)
Summary: Rx  Phone Note Refill Request Call back at [phone]  on daughter  Refills Requested: Medication #1:  SIMVASTATIN 80 MG TABS one by mouth daily requesting this medication in the generic form until Nov bc new ins is not effective until then   Follow-up for Phone Call        Called and LM on daughter's phone that the simvastitin is already the generic form.  Asked to return call to clinic if she needed any further action Follow-up by: Doralee Albino MD,  April 04, 2010 10:53 AM

## 2010-08-16 NOTE — Progress Notes (Signed)
  Phone Note Call from Patient   Caller: Daughter Call For: [phone] Summary of Call: Daughter called for refill for the wrong medicine.  She needed the ATacand, but she can't get it until her new insurance take effect in January. The pharmacist suggested she call back to the office and get the doctor to prescribe a generic brand.    Initial call taken by: Britta Mccreedy mcgregor  Follow-up for [employer] and discussed.  will switch to top dose of generic losartan.   Follow-up by: Doralee Albino MD,  April 04, 2010 3:02 PM    New/Updated Medications: LOSARTAN POTASSIUM 100 MG TABS (LOSARTAN POTASSIUM) one by mouth daily (this medicine takes the place of atacand) Prescriptions: LOSARTAN POTASSIUM 100 MG TABS (LOSARTAN POTASSIUM) one by mouth daily (this medicine takes the place of atacand)  #90 x 3   Entered and Authorized by:   Doralee Albino MD   Signed by:   Doralee Albino MD on 04/04/2010   Method used:   Electronically to        [facility] [phone]* (retail)       [address]       [place], [place]  [address]       Ph: [phone]       Fax: [phone]   RxID:   [id number]   Appended Document:  states that even the generic is $200 and wants to know if she can go without this medicine until November when the insurance will cover. Kennifer - [phone]

## 2010-08-16 NOTE — Letter (Signed)
Summary: Wellness visit letter  [facility]  [address]   [place], [place] [address]   Phone: [phone]  Fax: [phone]    11/19/2009  Pamela Rivas [address] [place], [place]  [address]  Dear Ms. Beber,  We are happy to let you know that since you are covered under Medicare you are able to have a FREE visit at the [facility] to discuss your HEALTH. This is a new benefit for Medicare.  There will be no co-payment.  At this visit you will meet with Luretha Murphy an expert in wellness and the nurse practitioner at our clinic.  At this visit we will discuss ways to keep you healthy and feeling well.  This visit will not replace your regular doctor visit and we cannot refill medications.  We may schedule future blood work, give shots if needed, or schedule tests to look for hidden problems.   You will need to plan to be here at least one hour to talk about your medical history, your current status, review all of your medications, and discuss your future plans for your health.  This information will be entered into your record for your doctor to have and review.  If you are interested in staying healthy, this type of visit can help.  Please call the office at: [phone], to schedule a "Medicare Wellness Visit".  The day of the visit you should bring in all of your medications, including any vitamins, herbs, over the counter products you take.  Make a list of all the other doctors that you see, so we know who they are. If you have any other health documents please bring them.  We look forward to helping you stay healthy.  Sincerely,          Sincerely,   Luretha Murphy NP  Appended Document: Wellness visit letter mailed.

## 2010-08-18 NOTE — Assessment & Plan Note (Signed)
Summary: new pt/rt hip pain/interfering with gait/bmc   Vital Signs:  Patient profile:   77 year old female BP sitting:   150 / 73  Vitals Entered By: Lillia Pauls CMA (August 12, 2010 8:38 AM)  Primary Care Provider:  Doralee Albino MD  CC:  L hip pain.  History of Present Illness: 77yo female who communicates thru sign language to office today with c/o L hip pain x 1-year.  Accompanied by son who was providing Sign Language for the encounter.  Sees Dr. Leveda Anna at [facility], who thought she may have arthritis in the hip.  She has had increasing pain over the past several weeks & went to [facility] 08/08/10 for pain where x-rays showed mod to advanced DJD on left & moderate DJD on the right.  She has been taking vicodin 5/325 q hs to help with pain.  Pain mainly in the left groin, does not radiate.  Denies any back pain.  Denies any numbness/tingling.  Is having some night pain.  Denies any injury or trauma.  Allergies: 1)  ! 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  Son providing sign-language Head:  normocephalic and atraumatic.   Neck:  supple.   Lungs:  normal respiratory effort.   Msk:  HIPS:  - L hip: decreased ROM in all planes with pain - flexion 90, ER 5, IR 15.  (+)Log roll.  Minimal TTP over greater  trochanter.   - R hip: FROM without pain.  neg log roll.  BACK: no midline or paraspinal muscle tenderness.  Good ROM.  Neg SLR.  Good lower extremity strength  GAIT: walking with slight limp favoring left hip Pulses:  +2/4 DP & PT Neurologic:  sensation intact to light touch.   DTR +2/4 achilles, patella b/l Additional Exam:  IMAGING: personally reviewed x-rays from [facility] 08/08/10 - AP pelvis & L lateral showing moderate to advanced DJD of the L hip, no signs of acute fx.  R hip with moderate DJD.     Impression & Recommendations:  Problem # 1:  HIP PAIN, LEFT, CHRONIC (ICD-719.45) Assessment Deteriorated - Chronic left hip pain secondary to moderate to advanced DJD.  X-rays from 08/08/10 showing degenerative changes. - Pt quite uncomfortable with pain, will refer to [facility] for intra-articular L hip steroid injection - scheduled for next Friday - Can cont. to use Vicodin 5/325 1 tab q hs as needed pain - Discussed that definitive treatment would likely be hip replacement, but will exhaust all conservative measure before considering this - may f/u with [place] 6 weeks after injection, if doing well may f/u as needed - Son acted as sign Soil scientist for entire encounter - both pt & son expressed understanding of above  Her updated medication list for this problem includes:    Bayer Aspirin 325 Mg Tabs (Aspirin) .Marland Kitchen... Take 1 tablet  by mouth once a day    Hydrocodone-acetaminophen 5-325 Mg Tabs (Hydrocodone-acetaminophen) .Marland Kitchen... 1 tab by mouth q 6hrs as needed pain  Orders: Diagnostic X-Ray/Fluoroscopy (Diagnostic X-Ray/Flu)  Problem # 2:  OSTEOARTHRITIS, HIP (ICD-715.95) Assessment: Deteriorated - Moderate to advanced DJD of left hip with associated decreased ROM - Moderate DJD on right hip on x-rays, but no significant clinical findings on exam - Plan as outlined above  Her updated medication list for this problem includes:    Bayer Aspirin 325 Mg Tabs  (Aspirin) .Marland Kitchen... Take 1 tablet by mouth once a day    Hydrocodone-acetaminophen 5-325 Mg Tabs (Hydrocodone-acetaminophen) .Marland Kitchen... 1 tab by mouth q 6hrs as needed pain  Complete Medication List: 1)  Amlodipine Besylate 10 Mg Tabs (Amlodipine besylate) .... Once daily 2)  Bayer Aspirin 325 Mg Tabs (Aspirin) .... Take 1 tablet by mouth once a day 3)  Hydrochlorothiazide 12.5 Mg Tabs (Hydrochlorothiazide) .... Take 1 tab  by mouth every morning 4)  Simvastatin 80 Mg Tabs (Simvastatin) .... One by mouth daily 5)  Sm Calcium/vitamin D 500-200 Mg-unit Tabs (Calcium carbonate-vitamin d) .... Take 1 tablet by mouth twice a day 6)  Clonidine Hcl 0.2 Mg Tabs (Clonidine hcl) .... One tab two times a day 7)  Travatan 0.004 % Soln (Travoprost) .... Use drops daily 8)  Isopto Carpine 4 % Soln (Pilocarpine hcl) .... Use qid 9)  Alphagan P 0.1 % Soln (Brimonidine tartrate) .... Use bid 10)  Timoptic 0.5 % Soln (Timolol maleate) .... Use bid 11)  Hydrocodone-acetaminophen 5-325 Mg Tabs (Hydrocodone-acetaminophen) .Marland Kitchen.. 1 tab by mouth q 6hrs as needed pain   Orders Added: 1)  Diagnostic X-Ray/Fluoroscopy [Diagnostic X-Ray/Flu] 2)  Est. Patient Level IV [[address]]

## 2010-08-19 ENCOUNTER — Ambulatory Visit
Admission: RE | Admit: 2010-08-19 | Discharge: 2010-08-19 | Disposition: A | Discharge status: Home / Self Care | Payer: Medicare Other | Source: Ambulatory Visit | Attending: Family Medicine | Admitting: Family Medicine

## 2010-08-19 DIAGNOSIS — M25552 Pain in left hip: Secondary | ICD-10-CM

## 2010-08-23 ENCOUNTER — Telehealth: Payer: Self-pay | Admitting: Family Medicine

## 2010-08-26 ENCOUNTER — Telehealth: Payer: Self-pay | Admitting: Family Medicine

## 2010-08-26 DIAGNOSIS — M25559 Pain in unspecified hip: Secondary | ICD-10-CM

## 2010-08-26 NOTE — Telephone Encounter (Signed)
We talked and will proceed with ortho referral

## 2010-08-29 ENCOUNTER — Other Ambulatory Visit: Payer: Self-pay | Admitting: *Deleted

## 2010-08-29 ENCOUNTER — Encounter: Payer: Self-pay | Admitting: Family Medicine

## 2010-09-01 NOTE — Progress Notes (Signed)
Summary: phn msg  Phone Note Call from Patient Call back at Home Phone [phone]   Caller: Daughter-Jennifer Summary of Call: states that she is going to need a hip replacement b/c of her arthritis - took steroid shot last Friday - just a FYI Initial call taken by: De Nurse,  August 23, 2010 11:29 AM  Follow-up for Phone Call        Noted. Follow-up by: Doralee Albino MD,  August 23, 2010 3:41 PM

## 2010-09-08 ENCOUNTER — Telehealth: Payer: Self-pay | Admitting: Family Medicine

## 2010-09-08 NOTE — Telephone Encounter (Signed)
Checking status of rx for amlodipine, [facility]

## 2010-09-14 NOTE — Telephone Encounter (Signed)
Daughter notified that I contacted pharmacy and patient does have refills. she last had filled on 09/01/2010 and too early for refill now.. Call if if further concern.

## 2010-09-14 NOTE — Telephone Encounter (Signed)
rx was sent in 03/2010 with 12 reflls. Will call pharmacy.

## 2010-09-17 ENCOUNTER — Encounter: Payer: Self-pay | Admitting: *Deleted

## 2010-09-19 ENCOUNTER — Telehealth: Payer: Self-pay | Admitting: *Deleted

## 2010-09-19 NOTE — Telephone Encounter (Signed)
PA required for Atacand. Form placed in MD box.

## 2010-09-21 NOTE — Telephone Encounter (Signed)
Reviewing med list, it does not include atacand.  Obviously, we have some confusion.  Needs appointment and bring in all meds to do careful med rec

## 2010-09-21 NOTE — Telephone Encounter (Signed)
Called and left message on voicemail for patient to call back   To discuss message from MD.

## 2010-09-22 NOTE — Telephone Encounter (Signed)
Spoke with son at mobile number listed. He states there was confusion about the atacand because his sister picked up wrong bottle to call in for refill. They understand that patient is not on this med now. Also advised son to call for appointment to follow up with Dr. Leveda Anna to review all meds .

## 2010-09-27 ENCOUNTER — Encounter: Payer: Self-pay | Admitting: Home Health Services

## 2010-10-05 ENCOUNTER — Ambulatory Visit (INDEPENDENT_AMBULATORY_CARE_PROVIDER_SITE_OTHER): Payer: Medicare Other | Admitting: Family Medicine

## 2010-10-05 VITALS — BP 150/96 | HR 76 | Temp 98.0°F | Wt 175.7 lb

## 2010-10-05 DIAGNOSIS — M25559 Pain in unspecified hip: Secondary | ICD-10-CM

## 2010-10-05 DIAGNOSIS — I10 Essential (primary) hypertension: Secondary | ICD-10-CM

## 2010-10-05 MED ORDER — CLONIDINE HCL 0.1 MG PO TABS: 0.1000 mg | ORAL_TABLET | Freq: Two times a day (BID) | ORAL | Status: DC

## 2010-10-05 NOTE — Progress Notes (Signed)
  Subjective:    Patient ID: Pamela Rivas, female    DOB: [birth year], 77 y.o.   MRN: [id number]  HPI Still working five days per week.  Plans to quit in May.  Plans to be seen for Lt hip replacement surgery after May.  Not taking any pain meds.  Previous meds did not help - so she will just put up with it until hip replacement  HBP poor control today.  In med recheck found that she has not been taking clonidine.    Review of Systems     Objective:   Physical Exam    Lungs clear Cardiac RRR without m    Assessment & Plan:

## 2010-10-05 NOTE — Patient Instructions (Signed)
I think you are a good candidate for hip replacement surgery. The new (actually restarting old) blood pressure medicine is at [facility]. Check your BP 3-4 times to make sure it is OK.  The nurse will work with you on the orthopedic referral.

## 2010-10-06 ENCOUNTER — Encounter: Payer: Self-pay | Admitting: Family Medicine

## 2010-10-06 NOTE — Assessment & Plan Note (Signed)
Poor control but not on clonidine.  Restart at lower dose.

## 2010-10-20 ENCOUNTER — Ambulatory Visit: Payer: Medicare Other | Admitting: Home Health Services

## 2010-11-17 ENCOUNTER — Other Ambulatory Visit: Payer: Self-pay | Admitting: Family Medicine

## 2010-11-17 NOTE — Telephone Encounter (Signed)
Refill request

## 2010-11-28 ENCOUNTER — Telehealth: Payer: Self-pay | Admitting: Family Medicine

## 2010-11-28 DIAGNOSIS — M25559 Pain in unspecified hip: Secondary | ICD-10-CM

## 2010-11-28 NOTE — Telephone Encounter (Signed)
Would like to come by and pick up referral to Ortho Surgeon.  She is in so much pain she wants to get this going.  Please advise

## 2010-11-28 NOTE — Assessment & Plan Note (Signed)
Ready to see ortho for hip replacement discussion

## 2010-11-28 NOTE — Telephone Encounter (Signed)
Spoke with patient's daughter to let her know about referral put in. She informed me that being her mother is in a lot of pain she went ahead and called [facility] and set up an appointment for 5/17 being that no referral was necessary.Pamela Rivas

## 2010-12-19 ENCOUNTER — Encounter (HOSPITAL_COMMUNITY)
Admission: RE | Admit: 2010-12-19 | Discharge: 2010-12-19 | Disposition: A | Discharge status: Home / Self Care | Payer: Medicare Other | Source: Ambulatory Visit | Attending: Orthopedic Surgery | Admitting: Orthopedic Surgery

## 2010-12-19 ENCOUNTER — Other Ambulatory Visit (HOSPITAL_COMMUNITY): Payer: Self-pay | Admitting: Orthopedic Surgery

## 2010-12-19 DIAGNOSIS — M169 Osteoarthritis of hip, unspecified: Secondary | ICD-10-CM

## 2010-12-19 LAB — URINALYSIS, ROUTINE W REFLEX MICROSCOPIC
Bilirubin Urine: NEGATIVE
Glucose, UA: NEGATIVE mg/dL
Hgb urine dipstick: NEGATIVE
Nitrite: NEGATIVE

## 2010-12-19 LAB — BASIC METABOLIC PANEL
CO2: 32 mEq/L (ref 19–32)
Chloride: 97 mEq/L (ref 96–112)
GFR calc Af Amer: >60 mL/min (ref >60)
Glucose, Bld: 81 mg/dL (ref 70–99)
Sodium: 138 mEq/L (ref 135–145)

## 2010-12-19 LAB — CBC
HCT: 41.9 % (ref 36.0–46.0)
Hemoglobin: 14.3 g/dL (ref 12.0–15.0)
WBC: 6.2 10*3/uL (ref 4.0–10.5)

## 2010-12-19 LAB — TYPE AND SCREEN
ABO/RH(D): A POS
Antibody Screen: NEGATIVE

## 2010-12-19 LAB — DIFFERENTIAL
Eosinophils Absolute: 0.1 10*3/uL (ref 0.0–0.7)
Eosinophils Relative: 2 % (ref 0–5)
Neutrophils Relative %: 46 % (ref 43–77)

## 2010-12-19 LAB — ABO/RH: ABO/RH(D): A POS

## 2010-12-19 LAB — PROTIME-INR: INR: 0.95 (ref 0.00–1.49)

## 2010-12-19 LAB — SURGICAL PCR SCREEN: MRSA, PCR: NEGATIVE

## 2010-12-21 ENCOUNTER — Encounter: Payer: Self-pay | Admitting: Internal Medicine

## 2010-12-22 ENCOUNTER — Ambulatory Visit (HOSPITAL_BASED_OUTPATIENT_CLINIC_OR_DEPARTMENT_OTHER): Payer: Medicare Other | Admitting: Radiology

## 2010-12-22 ENCOUNTER — Encounter: Payer: Self-pay | Admitting: Internal Medicine

## 2010-12-22 ENCOUNTER — Ambulatory Visit (INDEPENDENT_AMBULATORY_CARE_PROVIDER_SITE_OTHER): Payer: Medicare Other | Admitting: Internal Medicine

## 2010-12-22 DIAGNOSIS — I10 Essential (primary) hypertension: Secondary | ICD-10-CM

## 2010-12-22 DIAGNOSIS — I2510 Atherosclerotic heart disease of native coronary artery without angina pectoris: Secondary | ICD-10-CM

## 2010-12-22 DIAGNOSIS — E7800 Pure hypercholesterolemia, unspecified: Secondary | ICD-10-CM

## 2010-12-22 DIAGNOSIS — R011 Cardiac murmur, unspecified: Secondary | ICD-10-CM

## 2010-12-22 DIAGNOSIS — I119 Hypertensive heart disease without heart failure: Secondary | ICD-10-CM

## 2010-12-22 DIAGNOSIS — R9431 Abnormal electrocardiogram [ECG] [EKG]: Secondary | ICD-10-CM

## 2010-12-22 NOTE — Patient Instructions (Signed)
Your physician has requested that you have an echocardiogram. Echocardiography is a painless test that uses sound waves to create images of your heart. It provides your doctor with information about the size and shape of your heart and how well your heart's chambers and valves are working. This procedure takes approximately one hour. There are no restrictions for this procedure. To be done today.  Your physician recommends that you schedule a follow-up appointment as needed with Dr. Tenny Craw

## 2010-12-23 DIAGNOSIS — R9431 Abnormal electrocardiogram [ECG] [EKG]: Secondary | ICD-10-CM | POA: Insufficient documentation

## 2010-12-23 NOTE — Assessment & Plan Note (Signed)
Continue statin. 

## 2010-12-23 NOTE — Assessment & Plan Note (Signed)
BP is adequate.  Continue meds.

## 2010-12-23 NOTE — Assessment & Plan Note (Signed)
EKG most likely reflects LVH in patient with hypertension.  She is not that active but I do not hhink there are active ischemic issues I would recomm an echo to define anatomy.  I would continue an ASA.   If echo is normal except for LVH I think the patient is a low risk and OK to proceed with surgery without further testing.

## 2010-12-23 NOTE — Progress Notes (Signed)
HPI  Patient is a 77 year old who was referred for cardiac risk stratification. The patient has no known CAD.  She does have a history of hypertension and dyslipidemia She is undergoing evaluation for orthopedic surgery.  He activity is limited because of joint pains The pateint denies chest pain.  Breathing is OK.  She says that about 1 year ago she was doing much more because she was not in pain.  She denied chest pain with more rigorous activity in the past.  Allergies  Allergen Reactions  . Ace Inhibitors     REACTION: COUGH  . Cortisone     REACTION: has glaucoma  . Enalapril Maleate     REACTION: Cough with ACE    Current Outpatient Prescriptions  Medication Sig Dispense Refill  . amLODipine (NORVASC) 10 MG tablet Take 10 mg by mouth daily.        Marland Kitchen aspirin 81 MG tablet Take 81 mg by mouth daily.        . Calcium Carbonate-Vitamin D (CALCIUM-VITAMIN D) 500-200 MG-UNIT per tablet Take 1 tablet by mouth 2 (two) times daily with meals.        . cloNIDine (CATAPRES) 0.1 MG tablet Take 1 tablet (0.1 mg total) by mouth 2 (two) times daily.  60 tablet  11  . dorzolamide-timolol (COSOPT) 22.3-6.8 MG/ML ophthalmic solution Daily as directed      . hydrochlorothiazide (,MICROZIDE/HYDRODIURIL,) 12.5 MG capsule TAKE ONE CAPSULE BY MOUTH EVERY DAY IN THE MORNING  90 capsule  3  . pilocarpine (PILOCAR) 4 % ophthalmic solution 1 drop 4 (four) times daily.        . simvastatin (ZOCOR) 80 MG tablet Take 80 mg by mouth daily.          Past Medical History  Diagnosis Date  . Osteoarthritis   . HTN (hypertension)   . Glaucoma   . Hyperlipidemia     Past Surgical History  Procedure Date  . Bone density 10/00 t=-1.43   . Breast biopsy 1995 bnign ductal adenoma   . Cataract surgery lt eye 8/99     Family History  Problem Relation Age of Onset  . Cancer    . Diabetes      History   Social History  . Marital Status: Widowed    Spouse Name: N/A    Number of Children: N/A  . Years  of Education: N/A   Occupational History  . Not on file.   Social History Main Topics  . Smoking status: Former Games developer  . Smokeless tobacco: Never Used   Comment: quit in 1970's  . Alcohol Use: No  . Drug Use: No  . Sexually Active: Not Currently   Other Topics Concern  . Not on file   Social History Narrative  . No narrative on file    Review of Systems:  All systems reviewed.  They are negative to the above problem except as previously stated.  Vital Signs: BP 148/78  Pulse 72  Ht 5\' 10"  (1.778 m)  Wt 179 lb 12.8 oz (81.557 kg)  BMI 25.80 kg/m2  Physical Exam  Patient is in NAD  HEENT:  Normocephalic, atraumatic. EOMI, PERRLA.  Neck: JVP is normal. No thyromegaly. No bruits.  Lungs: clear to auscultation. No rales no wheezes.  Heart: Regular rate and rhythm. Normal S1, S2. No S3.   No significant murmurs. PMI not displaced.  Abdomen:  Supple, nontender. Normal bowel sounds. No masses. No hepatomegaly.  Extremities:   Good  distal pulses throughout. No lower extremity edema.  Musculoskeletal :moving all extremities.  Neuro:   alert and oriented x3.  CN II-XII grossly intact.  EKG:  Sinus rhythm.  LVH with repolarization abnormality.   Assessment and Plan:

## 2010-12-26 ENCOUNTER — Inpatient Hospital Stay (HOSPITAL_COMMUNITY): Payer: Medicare Other

## 2010-12-26 ENCOUNTER — Inpatient Hospital Stay (HOSPITAL_COMMUNITY)
Admission: RE | Admit: 2010-12-26 | Discharge: 2010-12-29 | DRG: 470 | Disposition: A | Discharge status: Home Health | Payer: Medicare Other | Source: Ambulatory Visit | Attending: Orthopedic Surgery | Admitting: Orthopedic Surgery

## 2010-12-26 DIAGNOSIS — E785 Hyperlipidemia, unspecified: Secondary | ICD-10-CM | POA: Diagnosis present

## 2010-12-26 DIAGNOSIS — Z01818 Encounter for other preprocedural examination: Secondary | ICD-10-CM

## 2010-12-26 DIAGNOSIS — Z01810 Encounter for preprocedural cardiovascular examination: Secondary | ICD-10-CM

## 2010-12-26 DIAGNOSIS — M169 Osteoarthritis of hip, unspecified: Principal | ICD-10-CM | POA: Diagnosis present

## 2010-12-26 DIAGNOSIS — R509 Fever, unspecified: Secondary | ICD-10-CM | POA: Diagnosis not present

## 2010-12-26 DIAGNOSIS — D72829 Elevated white blood cell count, unspecified: Secondary | ICD-10-CM | POA: Diagnosis present

## 2010-12-26 DIAGNOSIS — Z7901 Long term (current) use of anticoagulants: Secondary | ICD-10-CM

## 2010-12-26 DIAGNOSIS — H918X9 Other specified hearing loss, unspecified ear: Secondary | ICD-10-CM | POA: Diagnosis present

## 2010-12-26 DIAGNOSIS — H409 Unspecified glaucoma: Secondary | ICD-10-CM | POA: Diagnosis present

## 2010-12-26 DIAGNOSIS — I10 Essential (primary) hypertension: Secondary | ICD-10-CM | POA: Diagnosis present

## 2010-12-26 DIAGNOSIS — H269 Unspecified cataract: Secondary | ICD-10-CM | POA: Diagnosis present

## 2010-12-26 DIAGNOSIS — M1610 Unilateral primary osteoarthritis, unspecified hip: Principal | ICD-10-CM | POA: Diagnosis present

## 2010-12-27 LAB — CBC
MCHC: 33.2 g/dL (ref 30.0–36.0)
Platelets: 160 10*3/uL (ref 150–400)
RDW: 13.6 % (ref 11.5–15.5)

## 2010-12-27 LAB — PROTIME-INR
INR: 1.04 (ref 0.00–1.49)
Prothrombin Time: 13.8 seconds (ref 11.6–15.2)

## 2010-12-27 LAB — BASIC METABOLIC PANEL
CO2: 32 mEq/L (ref 19–32)
Calcium: 8.4 mg/dL (ref 8.4–10.5)
Creatinine, Ser: 0.73 mg/dL (ref 0.4–1.2)
GFR calc Af Amer: >60 mL/min (ref >60)
GFR calc non Af Amer: >60 mL/min (ref >60)

## 2010-12-27 NOTE — Consult Note (Signed)
Pamela Rivas, CHIRINO NO.:  [id number]  MEDICAL RECORD NO.:  [id number]  LOCATION:  5036                         FACILITY:  [facility]  PHYSICIAN:  Hollice Espy, M.D.DATE OF BIRTH:  May 19, 1934  DATE OF CONSULTATION:  12/26/2010 DATE OF DISCHARGE:                                CONSULTATION   REQUESTING PHYSICIAN:  Feliberto Gottron. Turner Daniels, MD, Orthopedic Surgery.  PRIMARY CARE PHYSICIAN:  Pricilla Riffle, MD, [facility].  REASON FOR CONSULTATION:  Management of hypertension.  HISTORY OF PRESENT ILLNESS:  The patient is a 77 year old African American female with a past medical history of full deafness, glaucoma, hypertension, hyperlipidemia, and osteoarthritis who underwent an elective total hip replacement on December 26, 2010 for end-stage DJD of that hip.  The patient herself is fully deaf and she is also seen postop from her surgery where she is currently sleeping from anesthesia, I am not able to wake her.  Therefore, I not able to get any kind of review of systems.  Her history is obtained from the medical record and in talking to her nurse.  Her arthroplasty was reportedly done without any complication.  Postop in the PACU, her blood pressure was noted to be elevated in the 150s-160s.  Then the patient was restarted on her Norvasc.  Since that time, her pressure has actually improved down to the 120 systolic but Hospice were still called to help with her blood pressure management which we happened to do so.  As I said before, the patient is currently resting from anesthesia.  We were not able to wake her, unable to get review of systems.  PAST MEDICAL HISTORY:  History of hypertension, hyperlipidemia, glaucoma, osteoarthritis and she is completely deaf.  MEDICATIONS: 1. Norco 5/325 one every 4 hours as needed. 2. Zocor 80. 3. Pilocarpine 4% ophthalmic, right eye, 1 drop 4 times a day. 4. Norvasc 10. 5. Hydrochlorothiazide 12.5. 6. Clonidine 0.1 b.i.d. 7. Cosopt 1  drop both eyes b.i.d.  ALLERGIES:  She has no known drug allergies.  I am unable to wake her to get a social history or family history.  PHYSICAL EXAMINATION:  VITAL SIGNS:  Her most recent set of vitals since returning back to the floor post OR are temperature 97.9, pulse rate 72, blood pressure 139/70, respirations 16, and O2 sat 96% on 2 L. GENERAL:  She is currently sleeping. HEENT:  Normocephalic and atraumatic.  Mucous membranes are slightly dry.  She has no carotid bruits. HEART:  Regular rate and rhythm.  S1-S2.  She has a 2/6 systolic ejection murmur. LUNGS:  Clear to auscultation bilaterally. ABDOMEN:  Soft and nondistended.  Hypoactive bowel sounds. EXTREMITIES:  No clubbing or cyanosis.  Trace pitting edema.  LABORATORY DATA:  We have the screening labs that were done approximately 7 days ago with a white count of 6.2, no shift, H and H 14 and 42, MCV of 87, and platelet count 208.  Unremarkable coags. Urinalysis that was essentially unremarkable.  Sodium 138, potassium 3.1, chloride 97, bicarb 32, BUN 20, creatinine 0.76, glucose 81, and calcium 9.6.  Her portable hip films done postprocedure reveals a left total hip arthroplasty, well-seated  femoral stem, and right hip osteoarthritis on her pelvis film.  Two-view screening chest is stable exam with cardiomegaly without acute cardiopulmonary process.  An EKG reviewed notes some signs of left ventricular hypertrophy but she has again no documented history of CAD.  ASSESSMENT AND PLAN: 1. Hypertensive management.  Likely her elevated blood pressures may     have been postprocedure with pain.  Given now with the anesthesia,     her pressures are stable.  For the overnight, we will give p.r.n.     Lopressor only.  Her systolic blood pressure had been 160.     Tomorrow, we will restart her clonidine p.o. and based on how she     is doing we can restart her hydrochlorothiazide if we can ease off     on her fluids.  Also  expect some blood loss anemia which will also     account for some mild decrease in her blood pressure.  It will be     important to her pain under control. 2. Degenerative joint disease of the hips status post arthroplasty,     currently stable.  Anticoagulation as per Orthopedic Surgery. 3. History of glaucoma.  Continue drops. 4. History of deafness.  Once the patient is awake, we will interpret     through a translator.  Please note, the patient appears to be stable and we do not see any reason why she may start a prolonged hospitalization.     Hollice Espy, M.D.     SKK/MEDQ  D:  12/26/2010  T:  12/26/2010  Job:  [id number]  cc:   Pricilla Riffle, MD, [facility]  Electronically Signed by Virginia Rochester M.D. on 12/27/2010 08:09:02 AM

## 2010-12-27 NOTE — Op Note (Signed)
Pamela Rivas, Pamela Rivas NO.:  [id number]  MEDICAL RECORD NO.:  [id number]  LOCATION:  5036                         FACILITY:  [facility]  PHYSICIAN:  Feliberto Gottron. Turner Daniels, M.D.   DATE OF BIRTH:  10-22-1933  DATE OF PROCEDURE:  12/26/2010 DATE OF DISCHARGE:                              OPERATIVE REPORT   PREOPERATIVE DIAGNOSIS:  Ends-stage arthritis left hip with protrusio.  POSTOPERATIVE DIAGNOSIS:  Ends-stage arthritis left hip with protrusio.  PROCEDURE:  Left total hip arthroplasty using DePuy, 56-mm pinnacle shell, central occluder, 10-degree polyethylene liner superoposterior, 18 x 13 x 42 x 160 S-ROM stem, 82F large cone, +0 36-mm ceramic head.  SURGEON:  Feliberto Gottron. Turner Daniels, MD  FIRST ASSISTANT:  Shirl Harris, PA-C  ANESTHETIC:  General endotracheal.  ESTIMATED BLOOD LOSS:  300 mL.  FLUID REPLACEMENT:  1500 mL of crystalloid.  DRAINS PLACED:  Foley catheter.  URINE OUTPUT:  300 mL.  INDICATIONS FOR PROCEDURE:  A 77 year old woman with protrusio and end- stage arthritis of the left hip bone on bone especially to the medial pole where she also has subchondral cystic changes.  She has similar findings to the right hip, but less impressive and has failed conservative measures with antiinflammatory medicines, physical therapy, judicious use of narcotics and now desires elective left total hip arthroplasty to decrease pain and increase function.  She has also tried to use a cane and a walker and still has a sensation that she may fall down.  Risks and benefits of surgery are discussed, questions are answered.  DESCRIPTION OF PROCEDURE:  The patient was identified by armband, received preoperative IV antibiotics in the holding area at [facility], taken to operating room #5.  Appropriate anesthetic monitors were attached and endotracheal anesthesia was induced with the patient in supine position.  Foley catheter was inserted, rolled into the  right lateral decubitus position, fixed there with a Stulberg Mark II pelvic clamp and the left lower extremity was prepped and draped in the usual sterile fashion from the ankle to the hemipelvis.  A time-out procedure was performed.  Skin along the lateral hip and thigh was infiltrated 20 mL of 0.5% Marcaine and epinephrine solution and we made an 18-cm incision centered over the greater trochanter allowing a posterolateral approach to the hip joint through the skin, subcutaneous tissue, and the IT band laterally.  Small bleeders were identified and cauterized. Cobra retractors were placed between the gluteus minimus and the superior hip joint capsule and quadratus femoris and the inferior hip joint capsule isolating the piriformis and short external rotators which were tagged with a #2 Ethibond suture and cut off their insertion on the intertrochanteric crest.  This exposed the posterior aspect of the hip joint capsule which was developed into an acetabular-based flap going from posterior-superior out of the femoral neck and exiting posteroinferior.  This flap was tagged with two #2 Ethibond sutures and the flap was used to cover the sciatic nerve.  Hip was then flexed internally and rotated dislocating the femoral head.  Standard neck cut performed one fingerbreadth above the lesser trochanter and the proximal femur translated anteriorly levering off the anterior column with  a Personal assistant.  The posteroinferior wing retractor was then placed enhancing our acetabular exposure.  Most of the labrum had calcified and had to be removed with rongeurs.  We then sequentially reamed up to a 55- mm basket reamer obtaining good coverage in all quadrants just down to subchondral bleeding bone.  The acetabulum was then irrigated out with normal saline solution and a 56-mm pinnacle shell was hammered into place in 40 degrees of abduction, 20 degrees of anteversion with good fit and fill.   Trial 10-degree liner was placed, index posterosuperior. Hip flexed and internally rotated exposing the proximal femur which was entered with the box cutting chisel followed by the initiating reamer and axial reaming up to 12 mm and 1 mm increments where we started to get a little bit of chatter.  We then reamed up to 13, 5, and 0.5 mm increments obtaining good cortical chatter distally and reamed halfway down with the 14-mm reamer.  We then conically reamed up to an 6F cone to the appropriate depth for 42 base neck and milled to a large calcar. An 6F large trial cone was hammered into place followed by trial stem with a 42 base neck and a +0 36 mm head.  The hip reduced, stability checked and noted be stable with 90 of flexion with almost 80 of internal rotation.  There was a block at about 30 of external rotation, so we elected to remove the trial liner from posterosuperior to superoposterior and obtained about 40 of external rotation.  The hip could not be dislocated in extension and external rotation.  At this point, the trial components were removed.  Wound was irrigated out with normal saline solution.  Central occluder was placed in the shell followed by a polyethylene liner to accept a 36-mm head, index superoposterior.  We then hammered into place an 6F large ZTT1 cone followed by an 18 x 13 x 160 x 42 stem in the same version of the calcar about 15 degrees.  A +0 36-mm ceramic head was hammered on the stem. The hip reduced and stability was noted to be excellent.  Wound was irrigated out with normal saline solution.  Capsular flap and short external rotators were repaired back to intertrochanteric crest through drill holes.  The IT band was closed with running #1 Vicryl suture and the subcutaneous tissue with 0 and 2-0 undyed Vicryl suture and skin with running interlocking 3-0 nylon suture.  A dressing of Xeroform and Mepilex was then applied.  The patient was unclamped,  rolled supine, awakened, and extubated and taken to the recovery room without difficulty.     Feliberto Gottron. Turner Daniels, M.D.     Ovid Curd  D:  12/26/2010  T:  12/27/2010  Job:  [id number]  Electronically Signed by Gean Birchwood M.D. on 12/27/2010 09:43:33 PM

## 2010-12-28 LAB — URINALYSIS, ROUTINE W REFLEX MICROSCOPIC
Bilirubin Urine: NEGATIVE
Ketones, ur: NEGATIVE mg/dL
Nitrite: NEGATIVE
Protein, ur: NEGATIVE mg/dL
Urobilinogen, UA: 0.2 mg/dL (ref 0.0–1.0)

## 2010-12-28 LAB — PROTIME-INR: Prothrombin Time: 17.2 seconds — ABNORMAL HIGH (ref 11.6–15.2)

## 2010-12-28 LAB — CBC
MCH: 29.6 pg (ref 26.0–34.0)
Platelets: 129 10*3/uL — ABNORMAL LOW (ref 150–400)
RBC: 2.97 MIL/uL — ABNORMAL LOW (ref 3.87–5.11)

## 2010-12-28 LAB — BASIC METABOLIC PANEL
BUN: 20 mg/dL (ref 6–23)
Creatinine, Ser: 0.89 mg/dL (ref 0.4–1.2)
GFR calc Af Amer: >60 mL/min (ref >60)
GFR calc non Af Amer: >60 mL/min (ref >60)
Potassium: 3.9 mEq/L (ref 3.5–5.1)

## 2010-12-28 LAB — WOUND CULTURE

## 2010-12-29 LAB — PROTIME-INR: Prothrombin Time: 19.6 seconds — ABNORMAL HIGH (ref 11.6–15.2)

## 2010-12-29 LAB — CBC
MCH: 29.2 pg (ref 26.0–34.0)
MCHC: 33.8 g/dL (ref 30.0–36.0)
Platelets: 135 10*3/uL — ABNORMAL LOW (ref 150–400)

## 2010-12-29 LAB — BASIC METABOLIC PANEL
BUN: 17 mg/dL (ref 6–23)
Creatinine, Ser: 0.69 mg/dL (ref 0.4–1.2)
GFR calc Af Amer: >60 mL/min (ref >60)
GFR calc non Af Amer: >60 mL/min (ref >60)

## 2010-12-31 LAB — URINE CULTURE: Colony Count: 35000

## 2010-12-31 LAB — ANAEROBIC CULTURE

## 2011-02-06 ENCOUNTER — Other Ambulatory Visit: Payer: Self-pay | Admitting: Family Medicine

## 2011-02-06 ENCOUNTER — Encounter: Payer: Self-pay | Admitting: Family Medicine

## 2011-02-06 NOTE — Telephone Encounter (Signed)
Refill request

## 2011-03-08 ENCOUNTER — Other Ambulatory Visit: Payer: Self-pay | Admitting: Family Medicine

## 2011-03-08 DIAGNOSIS — E7800 Pure hypercholesterolemia, unspecified: Secondary | ICD-10-CM

## 2011-03-08 MED ORDER — SIMVASTATIN 20 MG PO TABS: 20.0000 mg | ORAL_TABLET | Freq: Every day | ORAL | Status: DC

## 2011-03-08 NOTE — Telephone Encounter (Signed)
Refill request

## 2011-03-08 NOTE — Telephone Encounter (Signed)
Because of drug drug interaction with amlodipine, will decrease simvastatin to 20 mg daily and recheck cholesterol in 3 months.

## 2011-03-08 NOTE — Assessment & Plan Note (Signed)
Received e refill request for simvastatin 80.  On amlodipine so will lower to recommended dose of 20 and repeat FLP in three months.

## 2011-03-09 ENCOUNTER — Other Ambulatory Visit: Payer: Self-pay | Admitting: Family Medicine

## 2011-03-09 DIAGNOSIS — I10 Essential (primary) hypertension: Secondary | ICD-10-CM

## 2011-03-09 MED ORDER — AMLODIPINE BESYLATE 10 MG PO TABS: 10.0000 mg | ORAL_TABLET | Freq: Every day | ORAL | Status: DC

## 2011-03-09 NOTE — Assessment & Plan Note (Signed)
Refilled based on fax request 

## 2011-07-19 ENCOUNTER — Telehealth: Payer: Self-pay | Admitting: Family Medicine

## 2011-07-19 DIAGNOSIS — I10 Essential (primary) hypertension: Secondary | ICD-10-CM

## 2011-07-19 DIAGNOSIS — E7800 Pure hypercholesterolemia, unspecified: Secondary | ICD-10-CM

## 2011-07-19 NOTE — Telephone Encounter (Signed)
New rxs written for all meds for 76m supply to go to [facility] on [address].

## 2011-07-24 MED ORDER — HYDROCHLOROTHIAZIDE 12.5 MG PO CAPS: 12.5000 mg | ORAL_CAPSULE | Freq: Every day | ORAL | Status: DC

## 2011-07-24 MED ORDER — AMLODIPINE BESYLATE 10 MG PO TABS: 10.0000 mg | ORAL_TABLET | Freq: Every day | ORAL | Status: DC

## 2011-07-24 MED ORDER — CLONIDINE HCL 0.1 MG PO TABS: 0.1000 mg | ORAL_TABLET | Freq: Two times a day (BID) | ORAL | Status: DC

## 2011-07-24 MED ORDER — SIMVASTATIN 20 MG PO TABS: 20.0000 mg | ORAL_TABLET | Freq: Every day | ORAL | Status: DC

## 2011-07-24 NOTE — Telephone Encounter (Signed)
Reordered as requested

## 2011-08-06 IMAGING — CR DG PORTABLE PELVIS
1 series · 1 of 1 positions shown · non-contrast
Comparison: None.

CLINICAL DATA: Left hip arthroplasty, postop.

PORTABLE PELVIS

[AP]
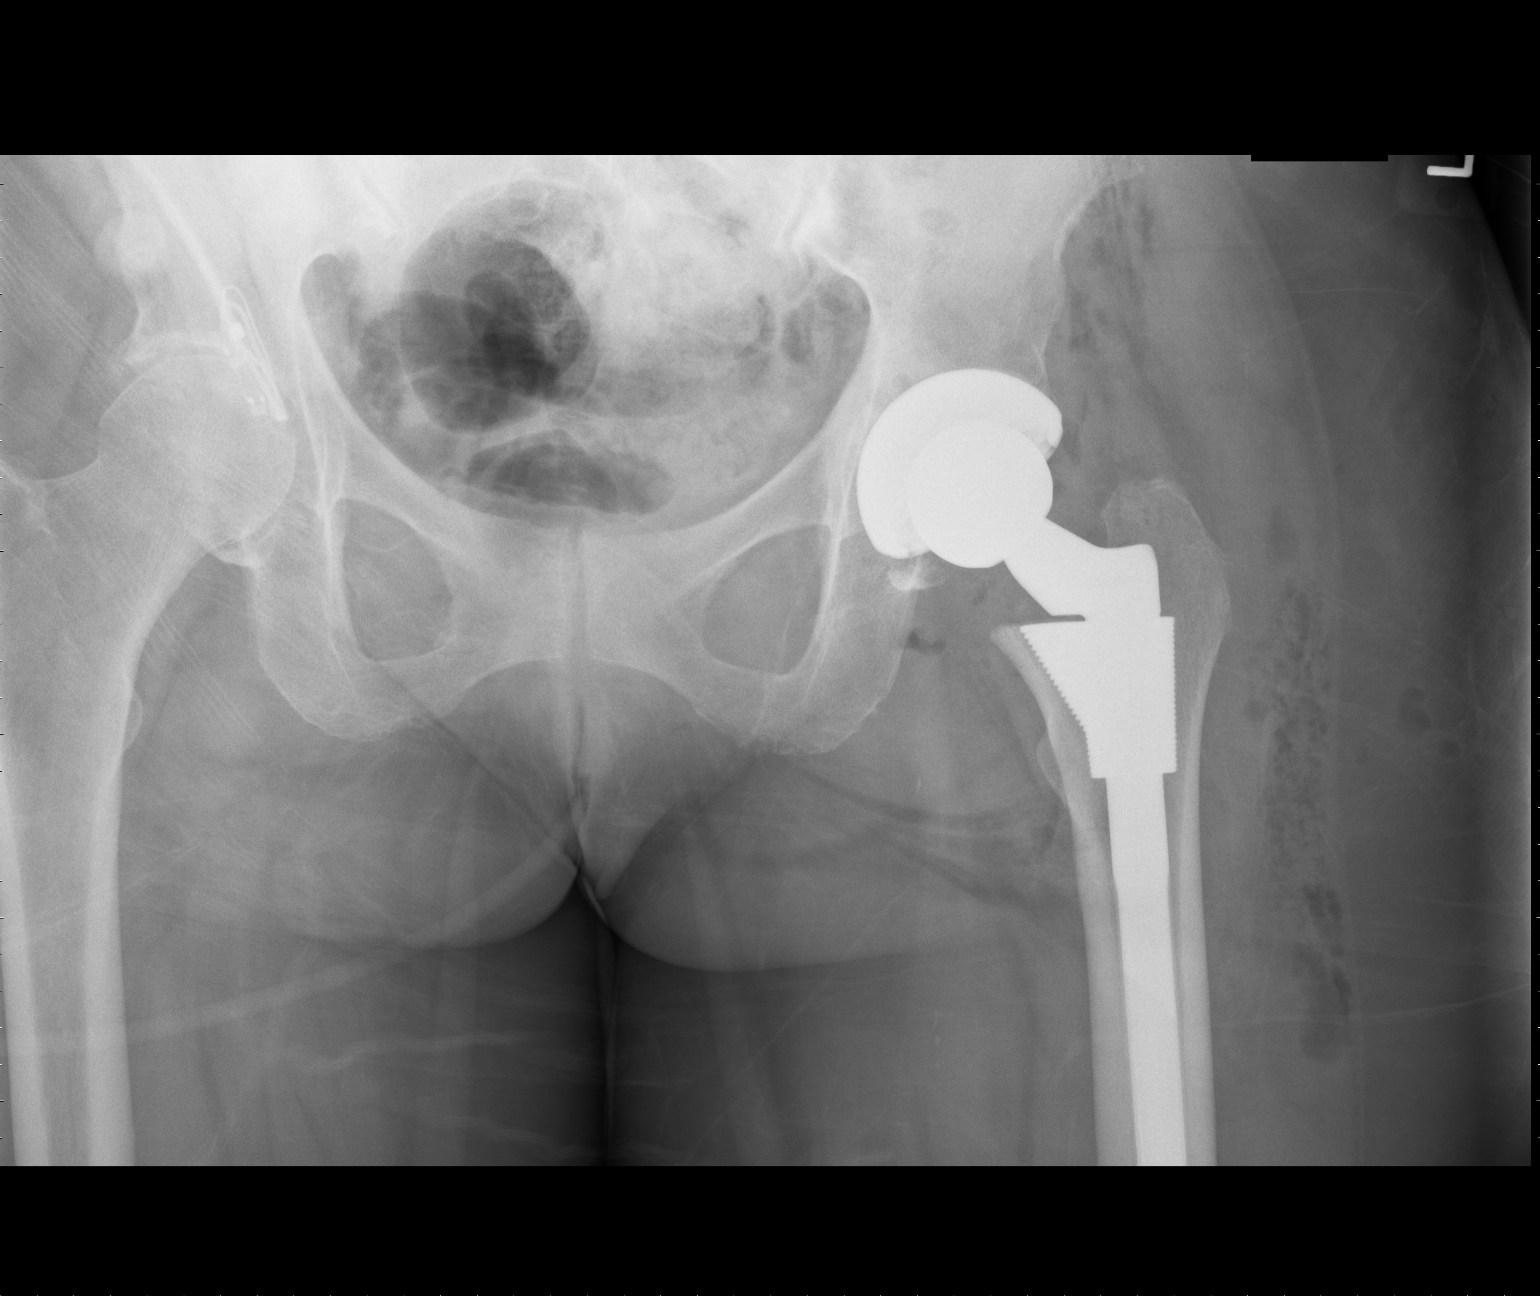

[1 of 1 positions shown; findings below may reference images not displayed]

FINDINGS: The patient is status post left hip arthroplasty with
subcutaneous emphysema.  There is joint space narrowing in the
right hip, with subchondral sclerosis.
IMPRESSION: 1.  Left hip arthroplasty with expected postoperative findings.
2.  Right hip osteoarthritis.

## 2012-06-27 ENCOUNTER — Other Ambulatory Visit: Payer: Self-pay | Admitting: *Deleted

## 2012-06-27 MED ORDER — HYDROCHLOROTHIAZIDE 12.5 MG PO CAPS: 12.5000 mg | ORAL_CAPSULE | Freq: Every day | ORAL | Status: DC

## 2012-08-07 ENCOUNTER — Other Ambulatory Visit: Payer: Self-pay | Admitting: Family Medicine

## 2013-05-12 ENCOUNTER — Telehealth: Payer: Self-pay

## 2013-05-12 NOTE — Telephone Encounter (Signed)
Refill request for amlodipine, clonidine, & simvastatin.

## 2013-05-13 MED ORDER — SIMVASTATIN 20 MG PO TABS: ORAL_TABLET | ORAL | Status: DC

## 2013-05-13 MED ORDER — AMLODIPINE BESYLATE 10 MG PO TABS: 10.0000 mg | ORAL_TABLET | Freq: Every day | ORAL | Status: DC

## 2013-05-13 MED ORDER — CLONIDINE HCL 0.1 MG PO TABS: ORAL_TABLET | ORAL | Status: DC

## 2013-05-28 ENCOUNTER — Encounter: Payer: Self-pay | Admitting: Family Medicine

## 2013-05-28 ENCOUNTER — Ambulatory Visit (INDEPENDENT_AMBULATORY_CARE_PROVIDER_SITE_OTHER): Payer: Medicare Other | Admitting: Family Medicine

## 2013-05-28 ENCOUNTER — Ambulatory Visit (HOSPITAL_COMMUNITY)
Admission: RE | Admit: 2013-05-28 | Discharge: 2013-05-28 | Disposition: A | Discharge status: Home / Self Care | Payer: Medicare Other | Source: Ambulatory Visit | Attending: Family Medicine | Admitting: Family Medicine

## 2013-05-28 VITALS — BP 155/77 | HR 68 | Temp 99.1°F | Ht 70.0 in | Wt 193.3 lb

## 2013-05-28 DIAGNOSIS — R413 Other amnesia: Secondary | ICD-10-CM | POA: Insufficient documentation

## 2013-05-28 DIAGNOSIS — I10 Essential (primary) hypertension: Secondary | ICD-10-CM

## 2013-05-28 DIAGNOSIS — Z23 Encounter for immunization: Secondary | ICD-10-CM

## 2013-05-28 DIAGNOSIS — E7800 Pure hypercholesterolemia, unspecified: Secondary | ICD-10-CM

## 2013-05-28 DIAGNOSIS — R9431 Abnormal electrocardiogram [ECG] [EKG]: Secondary | ICD-10-CM | POA: Insufficient documentation

## 2013-05-28 LAB — LIPID PANEL
HDL: 46 mg/dL (ref >39)
LDL Cholesterol: 106 mg/dL — ABNORMAL HIGH (ref 0–99)
Triglycerides: 123 mg/dL (ref <150)

## 2013-05-28 LAB — COMPLETE METABOLIC PANEL WITH GFR
ALT: 14 U/L (ref 0–35)
AST: 15 U/L (ref 0–37)
Albumin: 4.1 g/dL (ref 3.5–5.2)
CO2: 29 mEq/L (ref 19–32)
Calcium: 9.5 mg/dL (ref 8.4–10.5)
Chloride: 103 mEq/L (ref 96–112)
Creat: 1.01 mg/dL (ref 0.50–1.10)
GFR, Est African American: 62 mL/min
GFR, Est Non African American: 54 mL/min — ABNORMAL LOW
Glucose, Bld: 117 mg/dL — ABNORMAL HIGH (ref 70–99)
Potassium: 4 mEq/L (ref 3.5–5.3)
Sodium: 139 mEq/L (ref 135–145)
Total Protein: 7.5 g/dL (ref 6.0–8.3)

## 2013-05-28 LAB — CBC
MCH: 29 pg (ref 26.0–34.0)
MCHC: 33.3 g/dL (ref 30.0–36.0)
MCV: 87 fL (ref 78.0–100.0)
Platelets: 231 10*3/uL (ref 150–400)
RDW: 14.5 % (ref 11.5–15.5)
WBC: 6.1 10*3/uL (ref 4.0–10.5)

## 2013-05-28 MED ORDER — SIMVASTATIN 20 MG PO TABS: ORAL_TABLET | ORAL | Status: DC

## 2013-05-28 MED ORDER — HYDROCHLOROTHIAZIDE 12.5 MG PO CAPS: 12.5000 mg | ORAL_CAPSULE | Freq: Every day | ORAL | Status: DC

## 2013-05-28 MED ORDER — AMLODIPINE BESYLATE 10 MG PO TABS: 10.0000 mg | ORAL_TABLET | Freq: Every day | ORAL | Status: DC

## 2013-05-28 MED ORDER — CLONIDINE HCL 0.1 MG PO TABS: ORAL_TABLET | ORAL | Status: DC

## 2013-05-28 NOTE — Patient Instructions (Signed)
I refilled all your medications. Your EKG was OK.  You had an unimportant cause of the irregularity - PAC or premature atrial contractions. You blood pressure is a bit high See me in one month for a recheck. I will call with the other lab results.

## 2013-05-29 ENCOUNTER — Encounter: Payer: Self-pay | Admitting: Family Medicine

## 2013-05-29 NOTE — Progress Notes (Signed)
  Subjective:    Patient ID: Pamela Rivas, female    DOB: May 03, 1934, 79 y.o.   MRN: [id number]  HPI Deaf patient brought in by her daughter who is interpreter.  They came because I quit refilling meds.  Both are apologetic - they did not realize how long since last visit. Actually has been doing quite well.  No chest pain, SOB or leg swelling.  She is active and able to do all ADLs and IADLs.  Daughter has noticed a vague change over last 6 months.  Perhaps her memory is not as sharp.  Perhaps she is a bit more withdrawn.  Patient has ruminated a bit about needing to find work - when that is not a financial necessity.  These are all vague concerns and no serious lapses have occurred.   Does not take BP at home.    Review of Systems     Objective:   Physical ExamNeck, no bruit Lungs clear Cardiac RRR without m or g Ext no edema Neuro Oriented x 3.  Motor and sensory grossly intact.          Assessment & Plan:

## 2013-05-29 NOTE — Assessment & Plan Note (Signed)
Vague symptoms.  I feel it is reasonable to draw labs to rule out the reversible causes of dementia.  Will not get non contrast head CT at this point.

## 2013-05-29 NOTE — Assessment & Plan Note (Signed)
Control uncertain - she does not check BP at home.  Will FU in one month before adjusting any meds.

## 2013-05-29 NOTE — Assessment & Plan Note (Signed)
Had irregular heart rate on my exam.  I am reassured by EKG showing only PACs

## 2013-05-29 NOTE — Assessment & Plan Note (Signed)
Check labs 

## 2013-07-16 ENCOUNTER — Ambulatory Visit (INDEPENDENT_AMBULATORY_CARE_PROVIDER_SITE_OTHER): Payer: Medicare Other | Admitting: Family Medicine

## 2013-07-16 ENCOUNTER — Encounter: Payer: Self-pay | Admitting: Family Medicine

## 2013-07-16 ENCOUNTER — Encounter (INDEPENDENT_AMBULATORY_CARE_PROVIDER_SITE_OTHER): Payer: Self-pay

## 2013-07-16 VITALS — BP 130/76 | HR 66 | Temp 98.8°F | Wt 193.0 lb

## 2013-07-16 DIAGNOSIS — I10 Essential (primary) hypertension: Secondary | ICD-10-CM

## 2013-07-16 NOTE — Progress Notes (Signed)
   Subjective:    Patient ID: Pamela Rivas, female    DOB: 01/08/1934, 79 y.o.   MRN: [id number]  HPI Recheck hypertension.  Now back on all meds.  Blood pressure is great.  Feels fine.  Daughter has less concerns about memory.    Review of Systems     Objective:   Physical Exam BP normal Cardiac RRR without m or g       Assessment & Plan:

## 2013-07-16 NOTE — Assessment & Plan Note (Signed)
Now well controled.  Continue current meds.

## 2013-07-16 NOTE — Patient Instructions (Signed)
I am delighted that your blood pressure is good.  Stay on all the same medications.  I will see you in 6 months if things continue to go well.  Sooner if problems.   It would be good for you to sign up for my chart.   If you need any refills before the next visit, just have the pharmacy contact my office.

## 2014-06-12 ENCOUNTER — Other Ambulatory Visit: Payer: Self-pay | Admitting: Family Medicine

## 2014-06-15 ENCOUNTER — Other Ambulatory Visit: Payer: Self-pay | Admitting: Family Medicine

## 2014-06-15 MED ORDER — AMLODIPINE BESYLATE 10 MG PO TABS: 10.0000 mg | ORAL_TABLET | Freq: Every day | ORAL | Status: AC

## 2014-06-15 MED ORDER — SIMVASTATIN 20 MG PO TABS: 20.0000 mg | ORAL_TABLET | Freq: Every day | ORAL | Status: AC

## 2014-06-15 NOTE — Telephone Encounter (Signed)
Pt needs refills on amlodipine and simvastatin, pt has appt with MD on 06/24/14, goes to [facility]/[address].

## 2014-06-22 ENCOUNTER — Other Ambulatory Visit: Payer: Self-pay | Admitting: *Deleted

## 2014-06-22 DIAGNOSIS — R413 Other amnesia: Secondary | ICD-10-CM

## 2014-06-22 DIAGNOSIS — Z23 Encounter for immunization: Secondary | ICD-10-CM

## 2014-06-22 DIAGNOSIS — I10 Essential (primary) hypertension: Secondary | ICD-10-CM

## 2014-06-22 DIAGNOSIS — E7800 Pure hypercholesterolemia, unspecified: Secondary | ICD-10-CM

## 2014-06-22 DIAGNOSIS — R9431 Abnormal electrocardiogram [ECG] [EKG]: Secondary | ICD-10-CM

## 2014-06-22 MED ORDER — HYDROCHLOROTHIAZIDE 12.5 MG PO CAPS: 12.5000 mg | ORAL_CAPSULE | Freq: Every day | ORAL | Status: AC

## 2014-06-22 MED ORDER — CLONIDINE HCL 0.1 MG PO TABS: ORAL_TABLET | ORAL | Status: AC

## 2014-06-24 ENCOUNTER — Ambulatory Visit (INDEPENDENT_AMBULATORY_CARE_PROVIDER_SITE_OTHER): Payer: Medicare Other | Admitting: Family Medicine

## 2014-06-24 ENCOUNTER — Encounter: Payer: Self-pay | Admitting: Family Medicine

## 2014-06-24 ENCOUNTER — Other Ambulatory Visit: Payer: Self-pay | Admitting: Family Medicine

## 2014-06-24 ENCOUNTER — Ambulatory Visit (INDEPENDENT_AMBULATORY_CARE_PROVIDER_SITE_OTHER): Payer: Medicare Other | Admitting: *Deleted

## 2014-06-24 VITALS — BP 135/67 | HR 76 | Temp 98.0°F | Ht 70.0 in | Wt 190.0 lb

## 2014-06-24 DIAGNOSIS — E7800 Pure hypercholesterolemia, unspecified: Secondary | ICD-10-CM

## 2014-06-24 DIAGNOSIS — Z23 Encounter for immunization: Secondary | ICD-10-CM

## 2014-06-24 DIAGNOSIS — I10 Essential (primary) hypertension: Secondary | ICD-10-CM

## 2014-06-24 DIAGNOSIS — E119 Type 2 diabetes mellitus without complications: Secondary | ICD-10-CM

## 2014-06-24 DIAGNOSIS — R739 Hyperglycemia, unspecified: Secondary | ICD-10-CM

## 2014-06-24 LAB — CBC
HEMATOCRIT: 40.7 % (ref 36.0–46.0)
Hemoglobin: 13.9 g/dL (ref 12.0–15.0)
MCH: 29.2 pg (ref 26.0–34.0)
MCHC: 34.2 g/dL (ref 30.0–36.0)
MCV: 85.5 fL (ref 78.0–100.0)
MPV: 9.9 fL (ref 9.4–12.4)
Platelets: 244 10*3/uL (ref 150–400)
RBC: 4.76 MIL/uL (ref 3.87–5.11)
RDW: 14.2 % (ref 11.5–15.5)
WBC: 6.5 10*3/uL (ref 4.0–10.5)

## 2014-06-24 LAB — COMPREHENSIVE METABOLIC PANEL
ALT: 14 U/L (ref 0–35)
AST: 16 U/L (ref 0–37)
Albumin: 4 g/dL (ref 3.5–5.2)
Alkaline Phosphatase: 100 U/L (ref 39–117)
BILIRUBIN TOTAL: 0.3 mg/dL (ref 0.2–1.2)
BUN: 17 mg/dL (ref 6–23)
CO2: 28 mEq/L (ref 19–32)
CREATININE: 0.84 mg/dL (ref 0.50–1.10)
Calcium: 9.7 mg/dL (ref 8.4–10.5)
Chloride: 102 mEq/L (ref 96–112)
Glucose, Bld: 192 mg/dL — ABNORMAL HIGH (ref 70–99)
Potassium: 3.9 mEq/L (ref 3.5–5.3)
Sodium: 140 mEq/L (ref 135–145)
Total Protein: 7.5 g/dL (ref 6.0–8.3)

## 2014-06-24 LAB — LIPID PANEL
CHOLESTEROL: 188 mg/dL (ref 0–200)
HDL: 47 mg/dL (ref >39)
LDL Cholesterol: 102 mg/dL — ABNORMAL HIGH (ref 0–99)
Total CHOL/HDL Ratio: 4 Ratio
Triglycerides: 197 mg/dL — ABNORMAL HIGH (ref <150)
VLDL: 39 mg/dL (ref 0–40)

## 2014-06-24 LAB — TSH: TSH: 2.393 u[IU]/mL (ref 0.350–4.500)

## 2014-06-24 NOTE — Patient Instructions (Signed)
Things look good Stay on all the same medications. I will send a letter with results.  Also look on my chart. The biggest thing you can do to improve your health is to be more active. If the blood work is OK, I can see you again in one year.

## 2014-06-25 DIAGNOSIS — E119 Type 2 diabetes mellitus without complications: Secondary | ICD-10-CM | POA: Insufficient documentation

## 2014-06-25 LAB — HEMOGLOBIN A1C
Hgb A1c MFr Bld: 8.6 % — ABNORMAL HIGH (ref <5.7)
Mean Plasma Glucose: 200 mg/dL — ABNORMAL HIGH (ref <117)

## 2014-06-25 NOTE — Assessment & Plan Note (Signed)
Good control

## 2014-06-25 NOTE — Assessment & Plan Note (Signed)
Check labs 

## 2014-06-25 NOTE — Assessment & Plan Note (Signed)
Noted on CMP.  Not fasting - but 190 is high in any light.  Called lab and they will add on a HgbA1C.

## 2014-06-25 NOTE — Progress Notes (Signed)
   Subjective:    Patient ID: Pamela Rivas, female    DOB: 04/12/1934, 80 y.o.   MRN: [id number]  HPI Annual recheck of problems. Doing well on current meds without complaints.   HPDP due for cholesterol, flu and prenar. Taking BP meds without difficulty. Helping care for her daughter's husband who recently had CVA.    Review of Systems Denies CP, SOB, wt change, appetite change or bowel change.     Objective:   Physical Exam HEENT normal Neck supple Cardiac 2/6 SEM Lungs clear Abd benign. Ext trace bilateral edema. Neuro grossly normal other than deaf.      Assessment & Plan:

## 2014-06-26 ENCOUNTER — Encounter: Payer: Self-pay | Admitting: Family Medicine

## 2014-06-26 NOTE — Assessment & Plan Note (Signed)
A1C=8.6.  Clearly new diabetic.  Sent letter (cannot call, patient is deaf.)  To be seen soon.

## 2014-07-27 DIAGNOSIS — H4011X3 Primary open-angle glaucoma, severe stage: Secondary | ICD-10-CM | POA: Diagnosis not present

## 2014-07-27 DIAGNOSIS — H4011X1 Primary open-angle glaucoma, mild stage: Secondary | ICD-10-CM | POA: Diagnosis not present

## 2014-08-24 DIAGNOSIS — H4011X3 Primary open-angle glaucoma, severe stage: Secondary | ICD-10-CM | POA: Diagnosis not present

## 2014-08-24 DIAGNOSIS — H4011X1 Primary open-angle glaucoma, mild stage: Secondary | ICD-10-CM | POA: Diagnosis not present

## 2014-10-07 DIAGNOSIS — H4011X1 Primary open-angle glaucoma, mild stage: Secondary | ICD-10-CM | POA: Diagnosis not present

## 2014-10-07 DIAGNOSIS — H4011X3 Primary open-angle glaucoma, severe stage: Secondary | ICD-10-CM | POA: Diagnosis not present

## 2014-10-14 ENCOUNTER — Encounter: Payer: Self-pay | Admitting: Family Medicine

## 2014-10-16 ENCOUNTER — Telehealth: Payer: Self-pay | Admitting: Family Medicine

## 2014-10-16 NOTE — Progress Notes (Signed)
Patient ID: Kathlee NationsMabel L Mark, female   DOB: 01/02/1934, 81 y.o.   MRN: [id number] Sadly, I received a call today from EMS that Ms Pernell Dupredams had been found dead at home.  Police were on the seen but, per EMS, everything seemed to be in order.   The paramedic indicated that there was a small amount of bloody vomit and wondered if a GI bleed contributed. Spoke to son and offered my condolences.  He had last seen her 2 evenings ago and she was fine without complaints.   I indicated to the paramedic that I would sign the death certificate.  I plan to list sudden cardiac death as a cause.  With her known diabetes, hypertension and hypercholesterolemia, an acute arrythmia or other cardiac event is more likely that a massive GI bleed (she was on ASA.)  The fact that she was deaf may have slowed her ability to call for help when symptoms started.  Still, she was capable and my deduction is that the end came quite suddenly.

## 2014-10-16 NOTE — Telephone Encounter (Signed)
Calling to give Dr. Leveda AnnaHensel the funeral arrangements for the patients  Viewing: 2pm - 5pm on Sunday 10/18/2014 at [facility] [employer] [facility] [address], [place] [address] The Funeral: Monday 10/19/2014, viewing @ 1230 and service following @ 1pm ar [facility] [address] [place], [place] [address]  If Dr. Leveda AnnaHensel would like to contact the patient's son then he may do so at the number provided / thanks [employer], ASA

## 2014-10-16 DEATH — deceased
